# Patient Record
Sex: Female | Born: 1965 | Race: White | Hispanic: No | Smoking: Current every day smoker
Health system: Southern US, Community
[De-identification: ages and names within clinical notes are randomized; demographics above are authoritative.]

## PROBLEM LIST (undated history)

## (undated) DIAGNOSIS — Q625 Duplication of ureter: Secondary | ICD-10-CM

## (undated) DIAGNOSIS — R87613 High grade squamous intraepithelial lesion on cytologic smear of cervix (HGSIL): Secondary | ICD-10-CM

## (undated) DIAGNOSIS — J45909 Unspecified asthma, uncomplicated: Secondary | ICD-10-CM

## (undated) DIAGNOSIS — K5090 Crohn's disease, unspecified, without complications: Secondary | ICD-10-CM

## (undated) HISTORY — PX: EYE SURGERY: SHX253

## (undated) HISTORY — DX: High grade squamous intraepithelial lesion on cytologic smear of cervix (HGSIL): R87.613

## (undated) HISTORY — PX: APPENDECTOMY: SHX54

## (undated) HISTORY — DX: Crohn's disease, unspecified, without complications: K50.90

## (undated) HISTORY — PX: CHOLECYSTECTOMY: SHX55

## (undated) HISTORY — DX: Duplication of ureter: Q62.5

---
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
## 1996-04-17 HISTORY — PX: COLON RESECTION: SHX5231

## 2007-10-03 ENCOUNTER — Emergency Department: Payer: Self-pay | Admitting: Emergency Medicine
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
## 2011-05-03 ENCOUNTER — Ambulatory Visit: Payer: Self-pay | Admitting: Gastroenterology

## 2011-05-03 IMAGING — CR DG SMALL BOWEL
1 series · 12 of 12 positions shown · non-contrast
Comparison: none

REASON FOR EXAM: Crohn's  Disease  Abd Pain
COMMENTS:

PROCEDURE:     FL  - FL SMALL BOWEL  - [DATE] [DATE]
RESULT:
Procedure: Scout imaging of the abdomen was obtained. The patient was then
administered oral barium. Overhead imaging of the abdomen was obtained
followed by spot compression focal images.

[Series 1: run · 12 of 12 slices shown]
[im 1/12]
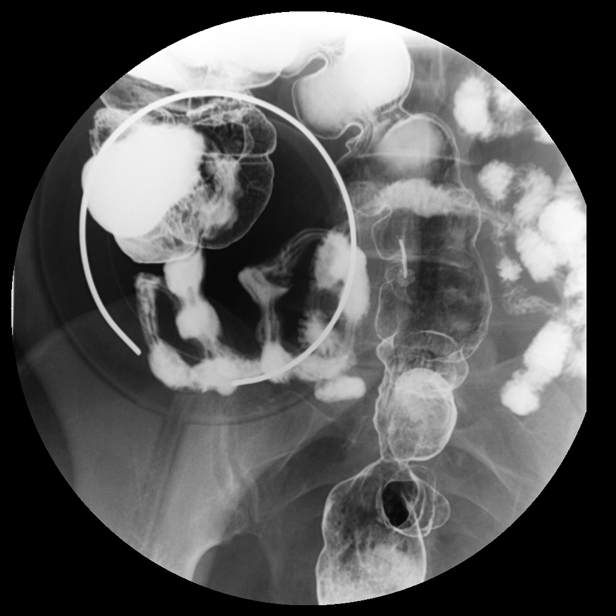
[im 2/12]
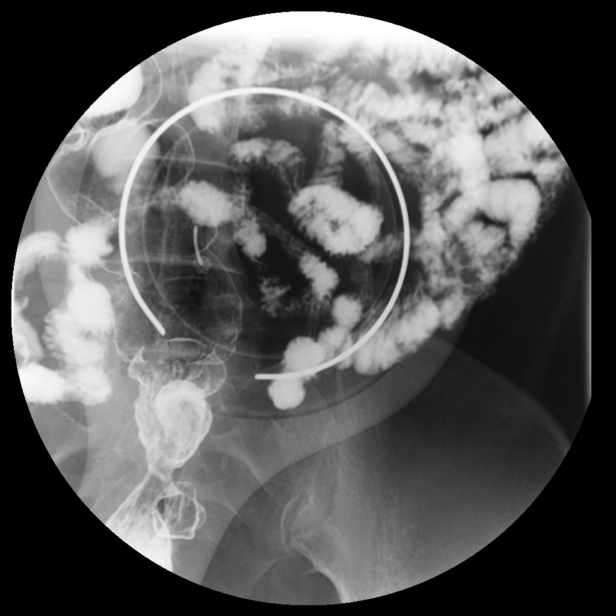
[im 3/12]
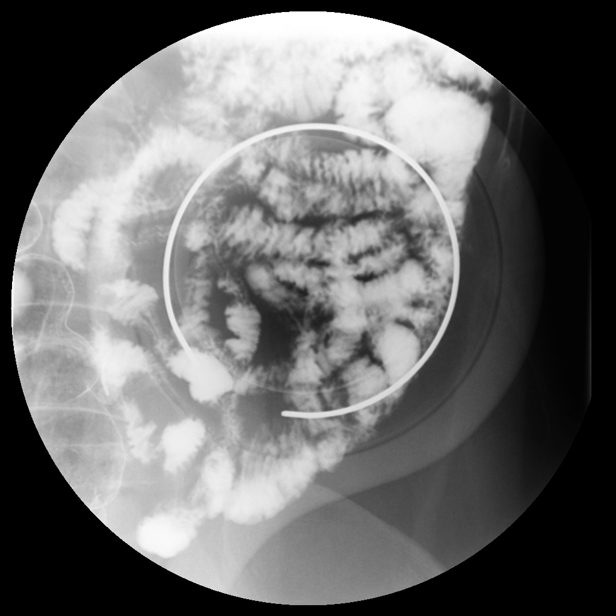
[im 4/12]
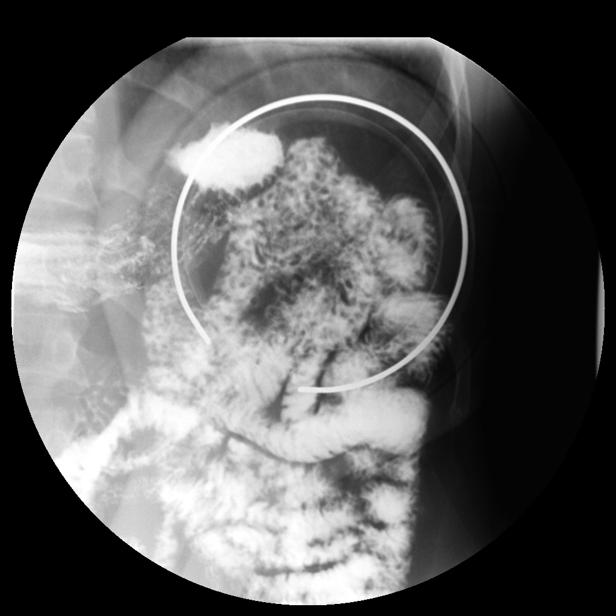
[im 5/12]
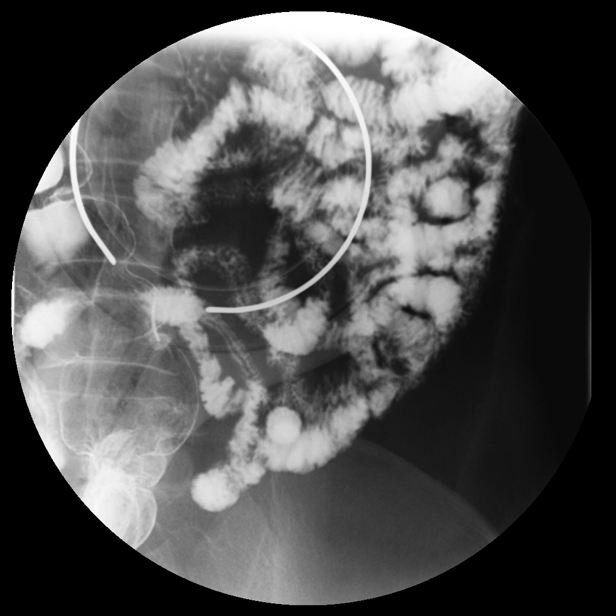
[im 6/12]
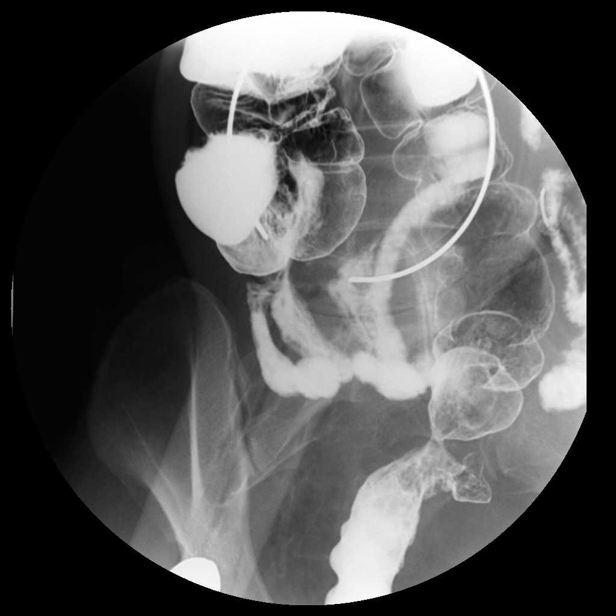
[im 7/12]
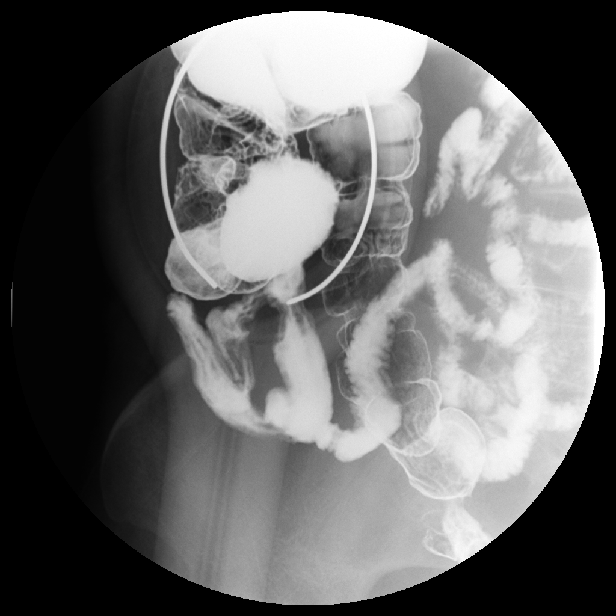
[im 8/12]
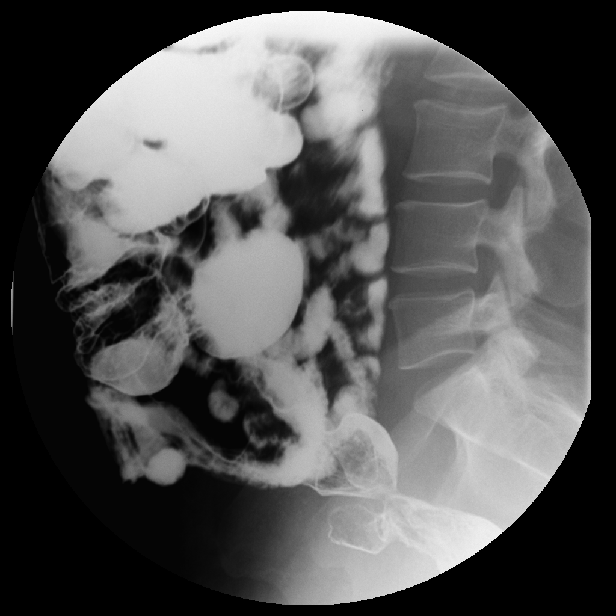
[im 9/12]
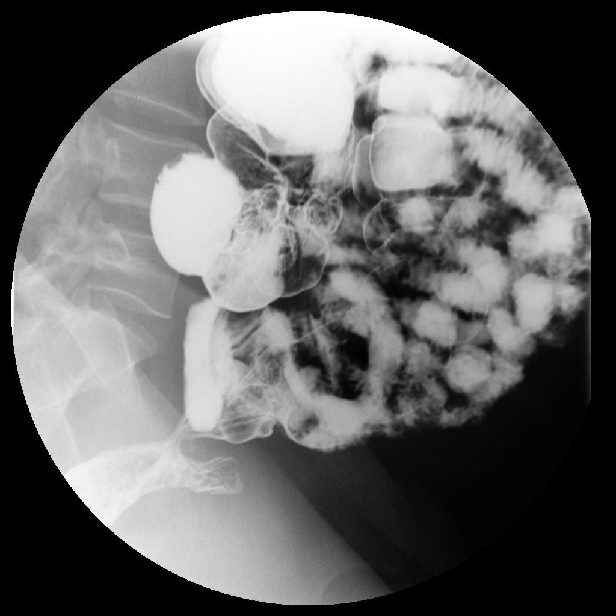
[im 10/12]
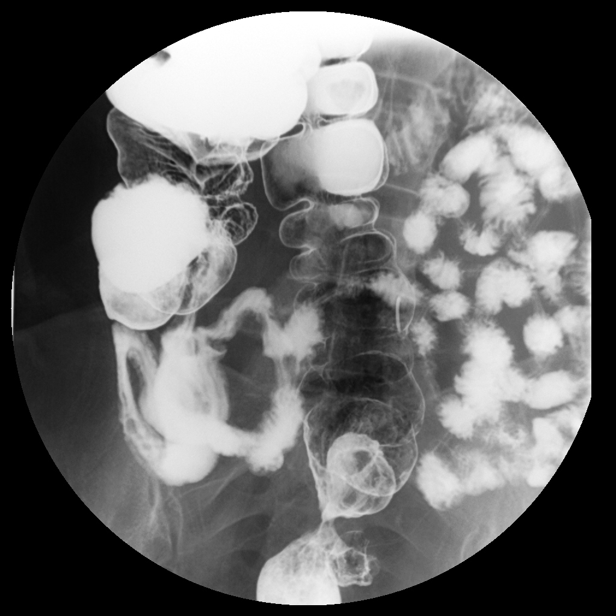
[im 11/12]
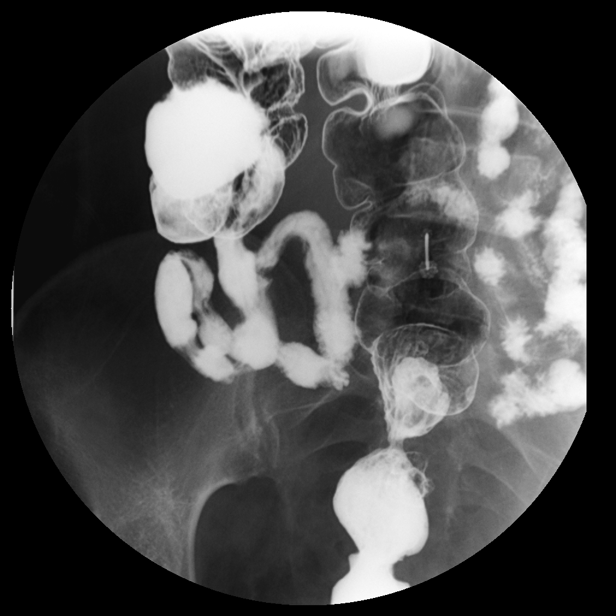
[im 12/12]
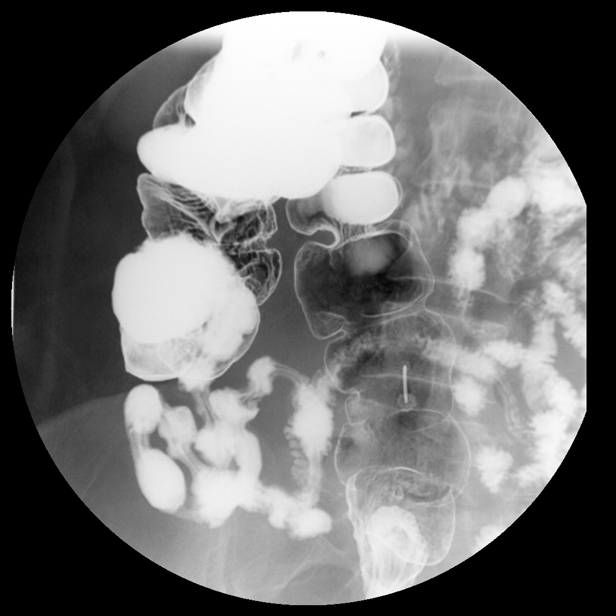

[12 of 12 positions shown; findings below may reference images not displayed]

FINDINGS: Air is seen within nondilated loops of large and small bowel. The
visualized bony skeleton is unremarkable.

Contrast is appreciated within the stomach and duodenum as well as the
jejunum and proximal ileum. The duodenum demonstrates appropriate rotation
and placement of the ligament of Treitz. Transit time to the terminal ileum
appears to be within normal limits.

Evaluation of the distal ileum demonstrates no evidence of filling defects
nor focal regions of narrowing. The peristalsis within the distal ileum
appears to be slightly decreased. The ileum does contract and distend. The
remaining components of the bowel demonstrate no further abnormalities.
IMPRESSION: Findings raising concern of decreased peristalsis within
the distal and terminal ileum without further abnormalities.

## 2011-05-30 ENCOUNTER — Ambulatory Visit: Payer: Self-pay | Admitting: Gastroenterology

## 2013-05-04 ENCOUNTER — Emergency Department: Payer: Self-pay | Admitting: Emergency Medicine

## 2013-05-04 LAB — CBC
HCT: 44.4 % (ref 35.0–47.0)
HGB: 14.6 g/dL (ref 12.0–16.0)
MCH: 29.2 pg (ref 26.0–34.0)
MCHC: 33 g/dL (ref 32.0–36.0)
MCV: 89 fL (ref 80–100)
PLATELETS: 262 10*3/uL (ref 150–440)
RBC: 5 10*6/uL (ref 3.80–5.20)
RDW: 14.2 % (ref 11.5–14.5)
WBC: 9.7 10*3/uL (ref 3.6–11.0)

## 2013-05-04 LAB — BASIC METABOLIC PANEL
Anion Gap: 8 (ref 7–16)
BUN: 10 mg/dL (ref 7–18)
CO2: 24 mmol/L (ref 21–32)
Calcium, Total: 8.7 mg/dL (ref 8.5–10.1)
Chloride: 106 mmol/L (ref 98–107)
Creatinine: 0.92 mg/dL (ref 0.60–1.30)
EGFR (Non-African Amer.): >60
GLUCOSE: 85 mg/dL (ref 65–99)
Osmolality: 274 (ref 275–301)
POTASSIUM: 4 mmol/L (ref 3.5–5.1)
Sodium: 138 mmol/L (ref 136–145)

## 2013-05-04 LAB — TROPONIN I: Troponin-I: <0.02 ng/mL

## 2013-05-04 IMAGING — CR DG CHEST 2V
1 series · 2 of 2 positions shown · non-contrast
Comparison: None.

CLINICAL DATA: Short of breath.  Cough.

EXAM:
CHEST  2 VIEW

[Series 1: w chest pa · 0.14mm/px · 2 of 2 slices shown]
[im 1/2]
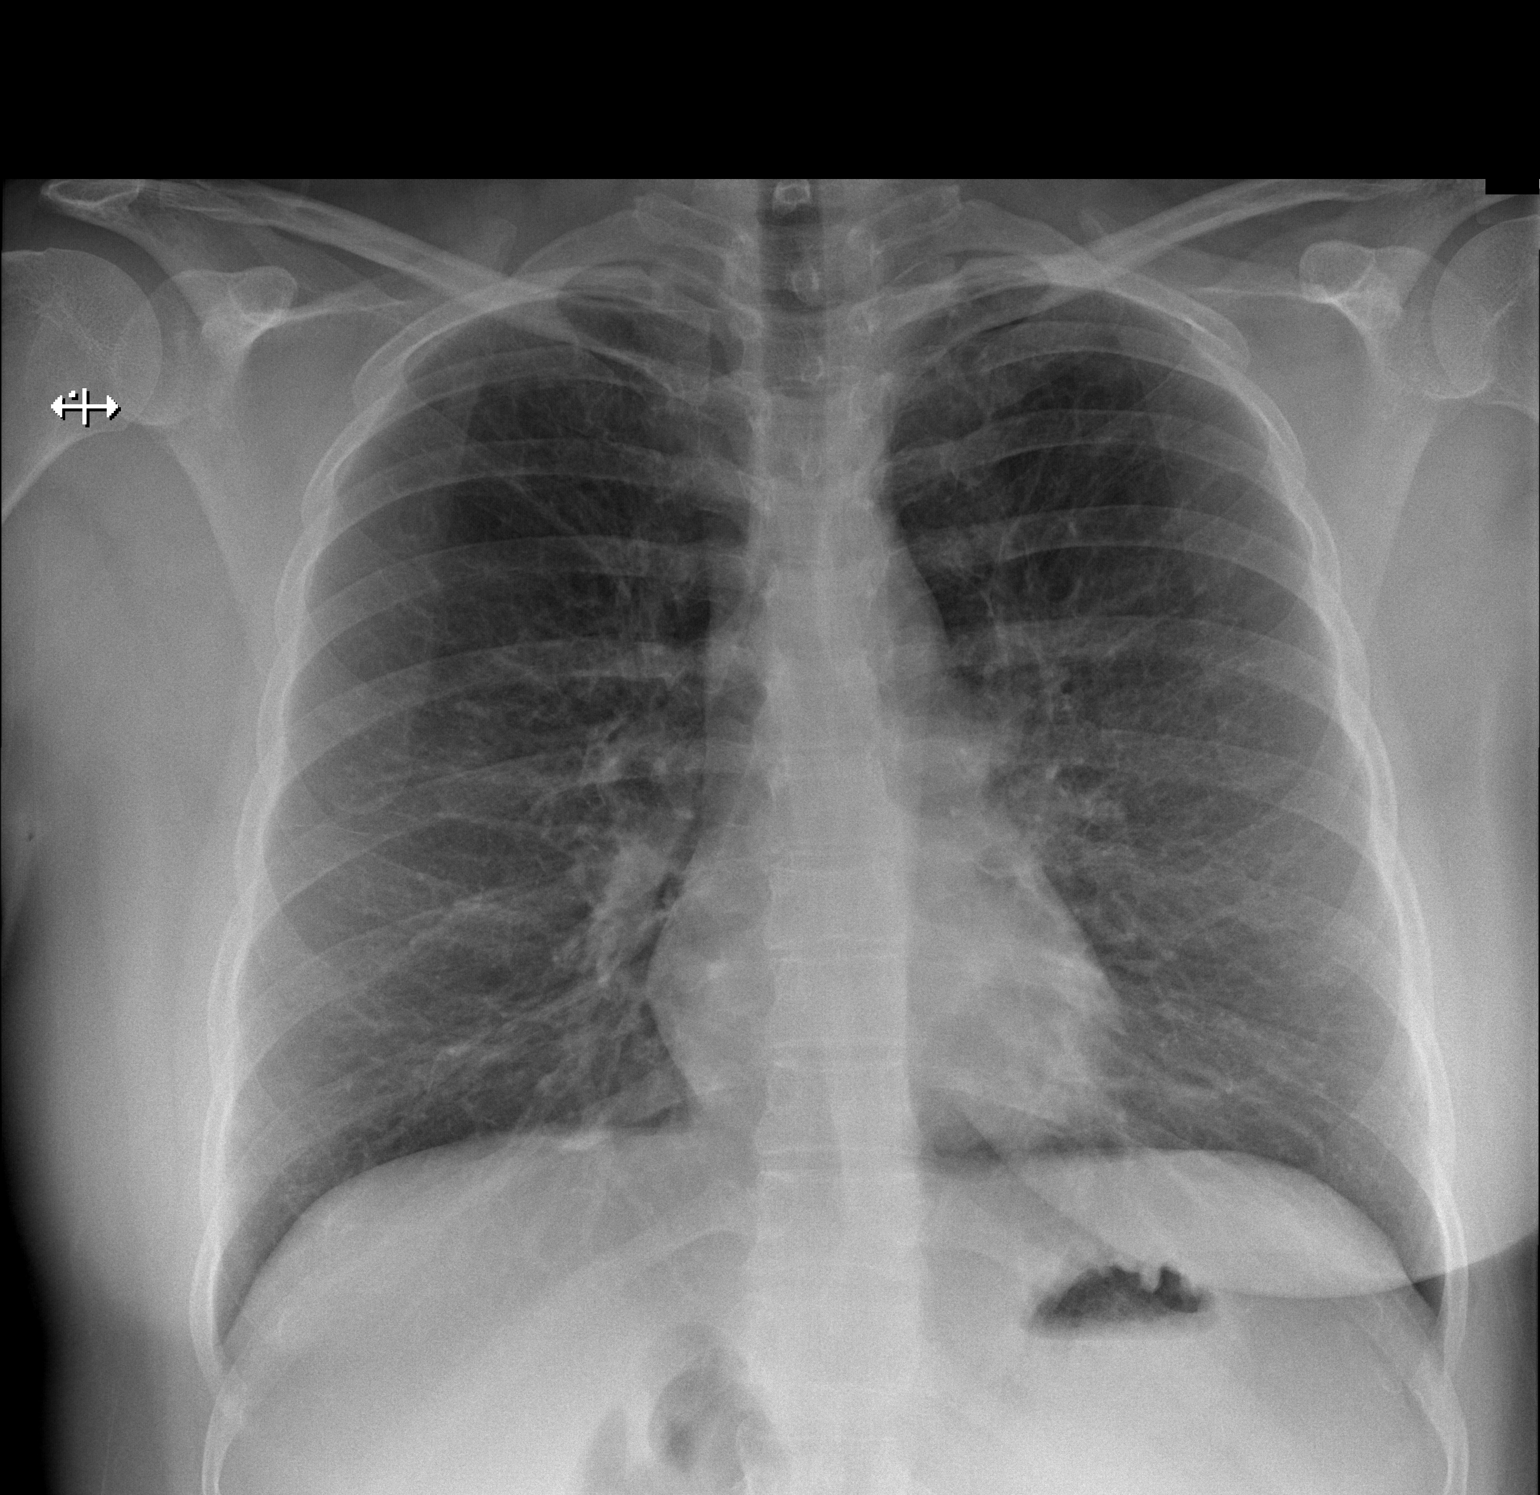
[im 2/2]
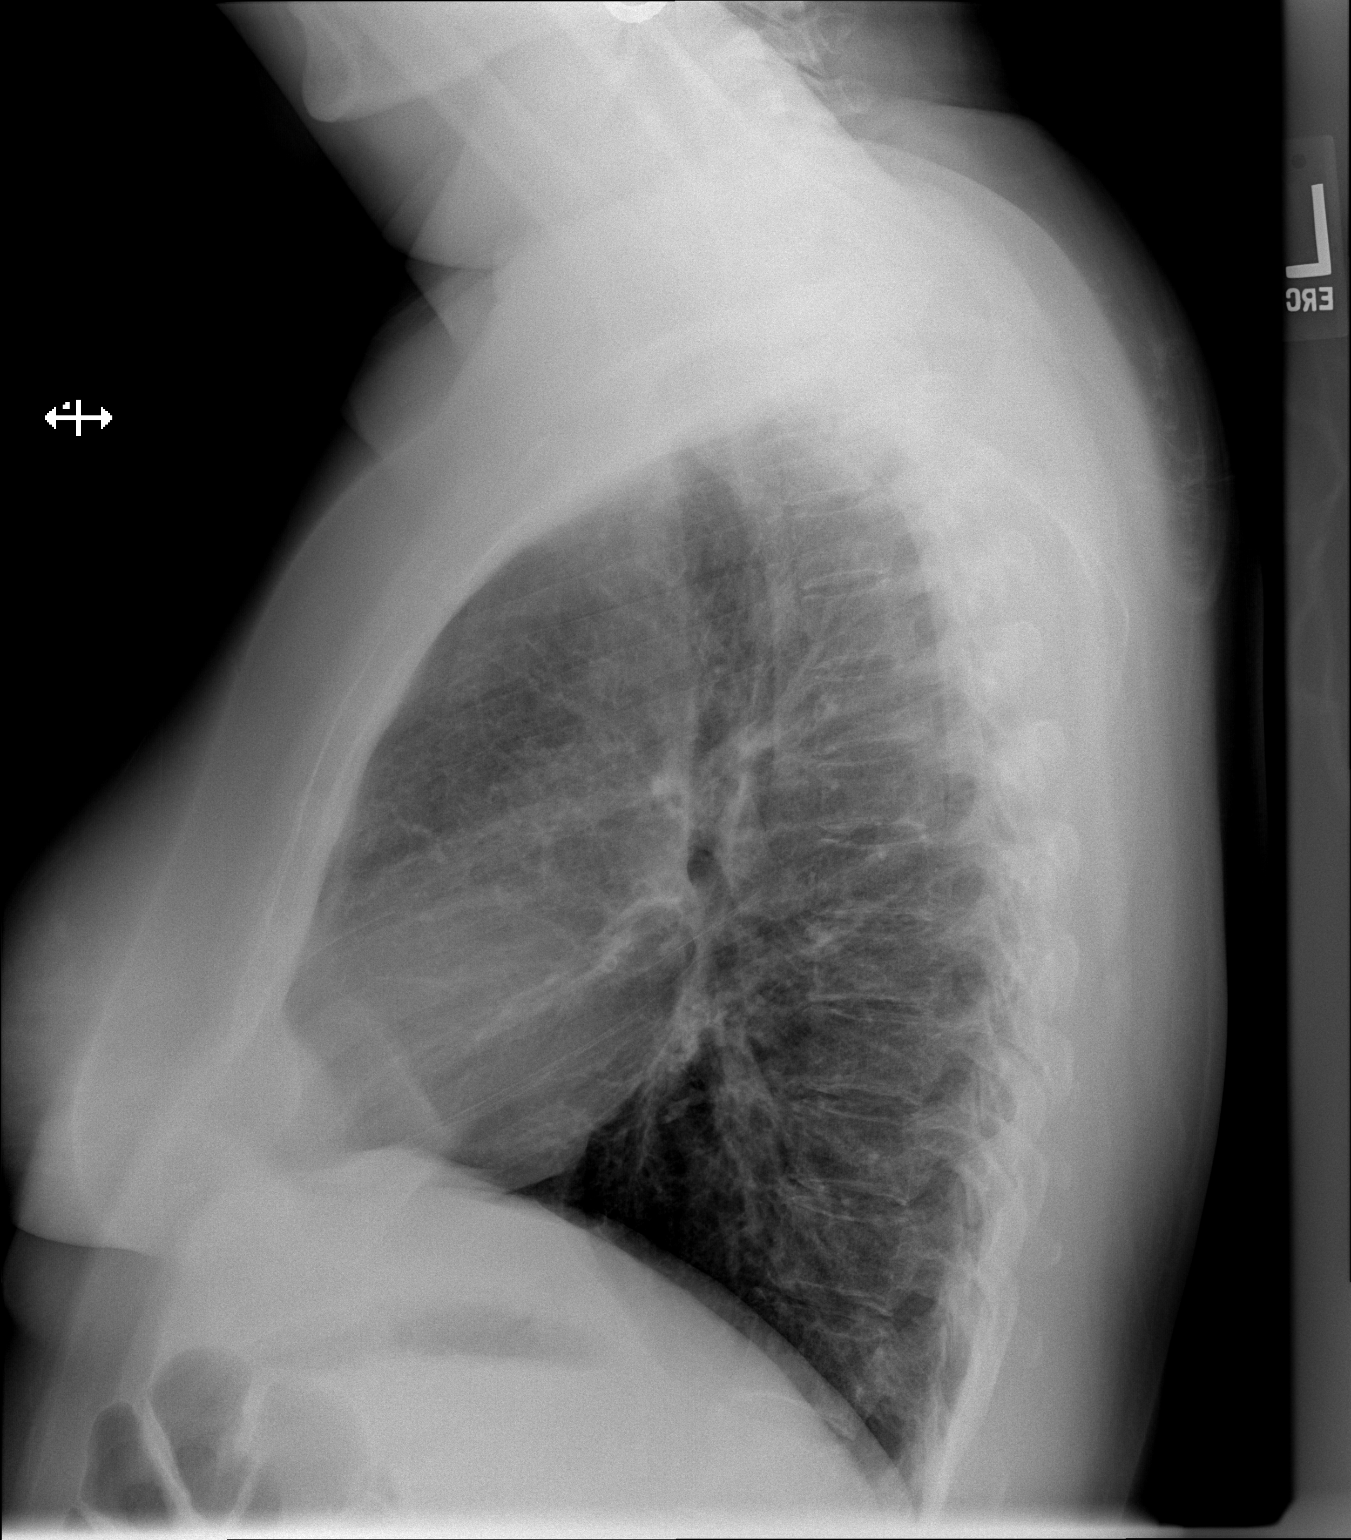

[2 of 2 positions shown; findings below may reference images not displayed]

FINDINGS: Midline trachea. Normal heart size and mediastinal contours. No
pleural effusion or pneumothorax. Diffuse peribronchial thickening.
No lobar consolidation.
IMPRESSION: Interstitial thickening, favored to be the sequelae of chronic
bronchitis or smoking. In the acute setting, especially if the
patient is a nonsmoker, atypical bacterial (mycoplasma) or viral
pneumonia would be a secondary concern.

## 2013-11-27 ENCOUNTER — Ambulatory Visit: Payer: Self-pay | Admitting: Gastroenterology

## 2014-08-06 DIAGNOSIS — Q625 Duplication of ureter: Secondary | ICD-10-CM | POA: Insufficient documentation

## 2014-08-06 DIAGNOSIS — R87613 High grade squamous intraepithelial lesion on cytologic smear of cervix (HGSIL): Secondary | ICD-10-CM

## 2014-08-06 DIAGNOSIS — N823 Fistula of vagina to large intestine: Secondary | ICD-10-CM | POA: Insufficient documentation

## 2014-08-06 DIAGNOSIS — K5090 Crohn's disease, unspecified, without complications: Secondary | ICD-10-CM | POA: Insufficient documentation

## 2014-08-06 HISTORY — DX: Duplication of ureter: Q62.5

## 2014-09-16 ENCOUNTER — Inpatient Hospital Stay: Payer: BLUE CROSS/BLUE SHIELD | Attending: Obstetrics and Gynecology | Admitting: Obstetrics and Gynecology

## 2014-09-16 VITALS — BP 155/100 | HR 105 | Temp 98.5°F | Resp 18 | Ht 61.0 in | Wt 160.7 lb

## 2014-09-16 DIAGNOSIS — Z79899 Other long term (current) drug therapy: Secondary | ICD-10-CM | POA: Insufficient documentation

## 2014-09-16 DIAGNOSIS — N939 Abnormal uterine and vaginal bleeding, unspecified: Secondary | ICD-10-CM

## 2014-09-16 DIAGNOSIS — M4802 Spinal stenosis, cervical region: Secondary | ICD-10-CM | POA: Diagnosis not present

## 2014-09-16 DIAGNOSIS — K5090 Crohn's disease, unspecified, without complications: Secondary | ICD-10-CM | POA: Diagnosis not present

## 2014-09-16 DIAGNOSIS — R197 Diarrhea, unspecified: Secondary | ICD-10-CM | POA: Insufficient documentation

## 2014-09-16 DIAGNOSIS — Z7952 Long term (current) use of systemic steroids: Secondary | ICD-10-CM | POA: Insufficient documentation

## 2014-09-16 DIAGNOSIS — Q625 Duplication of ureter: Secondary | ICD-10-CM | POA: Diagnosis not present

## 2014-09-16 DIAGNOSIS — F17210 Nicotine dependence, cigarettes, uncomplicated: Secondary | ICD-10-CM | POA: Diagnosis not present

## 2014-09-16 DIAGNOSIS — Z9049 Acquired absence of other specified parts of digestive tract: Secondary | ICD-10-CM

## 2014-09-16 DIAGNOSIS — R87613 High grade squamous intraepithelial lesion on cytologic smear of cervix (HGSIL): Secondary | ICD-10-CM | POA: Diagnosis not present

## 2014-09-16 HISTORY — DX: Abnormal uterine and vaginal bleeding, unspecified: N93.9

## 2014-09-16 NOTE — Progress Notes (Signed)
Gynecologic Oncology Interval Note  Chief Complaint: HSIL PAP and abnormal uterine bleeding for preop evaluation.  Subjective:  Oncology Treatment History:  Elizabeth Zhang is a 49 y.o. female who is seen in consultation from Dr. Mick Sell for HGSIL pap smear from 06/18/14,   Elizabeth Zhang has had many abnormal pap smears of the cervix over the years; she has been treated with both cryo therapy and a LEEP procedure, done about 10 years ago.   Her most recent pap history is as follows: 06/18/14 HSIL and ECC lGSIL 02/24/14 Colpo : Bx: LGSIL, ECC: HGSIL 01/23/14 ASCUS  [name] does endorse some post coital spotting for the past six months, as well as increasingly heavy periods for the past year. She now reports bleeding heavily for up to three weeks, using a pad or tampon almost every hour at its heaviest. She was recently started on Lysteda, which has shortened the bleeding to about two weeks. She has not had an endometrial biopsy done to her knowledge.   In terms of her other medical history, Elizabeth Zhang has a history of Crohn's disease, though states she is currently in remission. She had been on prednisone for nearly 12 years, and in 1998, required an exploratory laparotomy, large bowel resection and appendectomy with temporary colostomy, followed by a colostomy take down six weeks later. Most recently she had a small RV fistula, though has not yet pursued surgical management.  She does have some diarrhea.  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  Past Gynecologic History:  Menarche: 17 Menstrual details: Lasts up to 21 days days, heavy flow Menses regular: no Last Menstrual Period: 07/26/14 History of OCP/HRT use: No History of Abnormal pap: yes, HSIL, 3/16 Last pap: 3/16 History of STDs: The patient denies history of sexually transmitted disease. Contraception: none Sexually active: yes  Problem List: Patient  Active Problem List   Diagnosis Date Noted  . Abnormal uterine bleeding 09/16/2014  . HSIL (high grade squamous intraepithelial lesion) on Pap smear of cervix 09/16/2014   Past Medical History: Past Medical History  Diagnosis Date  . Crohn disease     1980  . Duplicated ureter, left   . Cesarean delivery delivered     Past Surgical History: Past Surgical History  Procedure Laterality Date  . Appendectomy    . Cholecystectomy      Family History: Family History  Problem Relation Age of Onset  . Heart attack Father     Had Triple Bypass  . Lung disease Mother     Double lung transplant 2011    Social History: History   Social History  . Marital Status: Married    Spouse Name: N/A  . Number of Children: N/A  . Years of Education: N/A   Occupational History  . Not on file.   Social History Main Topics  . Smoking status: Current Every Day Smoker -- 0.25 packs/day for 30 years    Types: Cigarettes  . Smokeless tobacco: Never Used  . Alcohol Use: No  . Drug Use: No  . Sexual Activity: Not on file   Other Topics Concern  . Not on file   Social History Narrative  . No narrative on file    Allergies: Allergies  Allergen Reactions  . Morphine Hives and Nausea And Vomiting  . Nickel Rash  . Penicillin G Rash    Current Medications: Current Outpatient Prescriptions  Medication Sig Dispense Refill  . ALPRAZolam (XANAX) 1 MG tablet Take by mouth.    . cetirizine (ZYRTEC) 10 MG tablet Take 1 tablet by mouth daily.    . ferrous sulfate 325 (65 FE) MG tablet Take 1 tablet by mouth daily.    . fluticasone (FLONASE) 50 MCG/ACT nasal spray Place 2 sprays into the nose daily.    Marland Kitchen omeprazole (PRILOSEC) 40 MG capsule Take 40 mg by mouth daily as needed.    . tranexamic acid (LYSTEDA) 650 MG TABS tablet Take 2 tablets by mouth 3 (three) times daily as needed.     No current facility-administered medications for this visit.     Review of Systems A comprehensive  review of systems was negative.  Objective:  Physical Examination:  BP 155/100 mmHg  Pulse 105  Temp(Src) 98.5 F (36.9 C) (Tympanic)  Resp 18  Ht 5' 1"  (1.549 m)  Wt 160 lb 11.5 oz (72.9 kg)  BMI 30.38 kg/m2  ECOG Performance Status: 0 - Asymptomatic  General appearance: alert, cooperative and appears stated age HEENT: PERRL, EOMI, sclera clear Neck: no thyroid enlargement or cervical adenopathy Lymph node survey: non-palpable nodes Cardiovascular: regular rate and rhythm, without murmurs, rubs or gallops Respiratory: clear to auscultation Abdomen: no palpable masses, no hernias, nontender, nondistended,bowel sounds present,without hepatosplenomegaly Back: inspection of back is normal Extremities: no lower extremity edema Skin exam - normal coloration and turgor, no rashes, no suspicious skin lesions noted. Neurological exam reveals alert, oriented, normal speech, no focal findings or movement disorder noted. Pelvic: deferred  Assessment:  Elizabeth Zhang is a 49 y.o. female with HSIL PAP with positive ECC and abnormal uterine bleeding with cervical stenosis. 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  Plan:   Problem List Items Addressed This Visit    Abnormal uterine bleeding   HSIL (high grade squamous intraepithelial lesion) on Pap smear of cervix - Primary     We discussed options for management including CKC given her HSIL Pap and positive ECC, as well as hysteroscopy, D&C given her abnormal uterine bleeding (unable to perform EMB in the office due to cervical stenosis). While she would otherwise be a good candidate for definitive surgical management (ie hysterectomy), she has a history of extensive abdominal adhesions, and therefore may not be a good candidate for laparoscopic surgery. She preferred to proceed with the CKC, and then proceed with more invasive surgery if invasive cancer was identified  in the uterus or cervix.   Therefore, we recommend a cold knife cone and hysteroscopy, D&C. Ms. Jobin was consented for an exam under anesthesia, colposcopy, cold knife conization, and endocervical curettage. She was counseled on the risks of surgery including but not limited to: Bleeding (possibly requiring transfusion), Infection (possibly requiring antibiotics), damage to nearby organs: bowel, bladder, blood vessels, nerves, ureters, uterus, vagina, cervix, need for further surgery/procedures, delayed wound healing, Anesthesia risks, medical complications: thromboembolic events (blood clot to lung, brain, legs), pneumonia, heart attack, stroke, death, cervical incompetence resulting in fertility/pregnancy complications and recurrence of dysplasia.   Ms. Guerrini's surgery was scheduled for 10/07/14: consents were signed today.  She was counseled to avoid unprotected intercourse between now and surgery, but will need a bHCG the day of surgery as she is not using any form of contraception.   Suggested return to clinic in 4-6 weeks after her surgery for a post-operative visit   The  patient's diagnosis, an outline of the further diagnostic and laboratory studies which will be required, the recommendation, and alternatives were discussed. All questions were answered to the patient's satisfaction.  Mellody Drown, MD  CC:  Mellody Drown, MD [facility] [place], [place] [address] [phone]

## 2014-09-16 NOTE — Patient Instructions (Signed)
Do not have unprotected sexual intercourse prior to surgery.  Your surgery will be scheduled on 10/07/14.  Renita Papa, RN will call you regarding your surgery date.

## 2014-09-24 ENCOUNTER — Telehealth: Payer: Self-pay | Admitting: *Deleted

## 2014-09-24 ENCOUNTER — Other Ambulatory Visit: Payer: BLUE CROSS/BLUE SHIELD

## 2014-09-24 NOTE — Telephone Encounter (Signed)
Patient notified regarding her preadmit testing apt. This has been scheduled for Monday, September 28, 2014 at 830am.  Surgery date is planned for 10/07/14.

## 2014-09-28 ENCOUNTER — Ambulatory Visit
Admission: RE | Admit: 2014-09-28 | Discharge: 2014-09-28 | Disposition: A | Discharge status: Home / Self Care | Payer: BLUE CROSS/BLUE SHIELD | Source: Ambulatory Visit | Attending: Obstetrics and Gynecology | Admitting: Obstetrics and Gynecology

## 2014-09-28 DIAGNOSIS — K5090 Crohn's disease, unspecified, without complications: Secondary | ICD-10-CM | POA: Insufficient documentation

## 2014-09-28 DIAGNOSIS — Z885 Allergy status to narcotic agent status: Secondary | ICD-10-CM | POA: Insufficient documentation

## 2014-09-28 DIAGNOSIS — Z01812 Encounter for preprocedural laboratory examination: Secondary | ICD-10-CM | POA: Diagnosis present

## 2014-09-28 DIAGNOSIS — N939 Abnormal uterine and vaginal bleeding, unspecified: Secondary | ICD-10-CM | POA: Diagnosis not present

## 2014-09-28 DIAGNOSIS — Z880 Allergy status to penicillin: Secondary | ICD-10-CM | POA: Diagnosis not present

## 2014-09-28 DIAGNOSIS — R87613 High grade squamous intraepithelial lesion on cytologic smear of cervix (HGSIL): Secondary | ICD-10-CM | POA: Diagnosis not present

## 2014-09-28 LAB — TYPE AND SCREEN
ABO/RH(D): B POS
Antibody Screen: NEGATIVE

## 2014-09-28 LAB — CBC WITH DIFFERENTIAL/PLATELET
Basophils Absolute: 0.1 10*3/uL (ref 0–0.1)
Basophils Relative: 0 %
EOS ABS: 0.2 10*3/uL (ref 0–0.7)
Eosinophils Relative: 1 %
HCT: 43.4 % (ref 35.0–47.0)
Hemoglobin: 13.9 g/dL (ref 12.0–16.0)
LYMPHS ABS: 1 10*3/uL (ref 1.0–3.6)
LYMPHS PCT: 6 %
MCH: 29.4 pg (ref 26.0–34.0)
MCHC: 32.1 g/dL (ref 32.0–36.0)
MCV: 91.4 fL (ref 80.0–100.0)
MONO ABS: 1.3 10*3/uL — AB (ref 0.2–0.9)
MONOS PCT: 7 %
NEUTROS PCT: 86 %
Neutro Abs: 15.7 10*3/uL — ABNORMAL HIGH (ref 1.4–6.5)
Platelets: 274 10*3/uL (ref 150–440)
RBC: 4.75 MIL/uL (ref 3.80–5.20)
RDW: 14.7 % — ABNORMAL HIGH (ref 11.5–14.5)
WBC: 18.2 10*3/uL — ABNORMAL HIGH (ref 3.6–11.0)

## 2014-09-28 LAB — BASIC METABOLIC PANEL
Anion gap: 6 (ref 5–15)
BUN: 9 mg/dL (ref 6–20)
CALCIUM: 8.4 mg/dL — AB (ref 8.9–10.3)
CO2: 21 mmol/L — ABNORMAL LOW (ref 22–32)
Chloride: 112 mmol/L — ABNORMAL HIGH (ref 101–111)
Creatinine, Ser: 0.79 mg/dL (ref 0.44–1.00)
GFR calc non Af Amer: >60 mL/min (ref >60)
GLUCOSE: 99 mg/dL (ref 65–99)
POTASSIUM: 3.9 mmol/L (ref 3.5–5.1)
Sodium: 139 mmol/L (ref 135–145)

## 2014-09-28 LAB — APTT: aPTT: 32 seconds (ref 24–36)

## 2014-09-28 LAB — PROTIME-INR
INR: 1.06
PROTHROMBIN TIME: 14 s (ref 11.4–15.0)

## 2014-09-28 LAB — ABO/RH: ABO/RH(D): B POS

## 2014-09-28 NOTE — Patient Instructions (Addendum)
  Your procedure is scheduled on: Wednesday October 07, 2014 Report to Same Day Surgery. To find out your arrival time please call [phone] between 1PM - 3PM on Tuesday October 06, 2014.  Remember: Instructions that are not followed completely may result in serious medical risk, up to and including death, or upon the discretion of your surgeon and anesthesiologist your surgery may need to be rescheduled.    __x__ 1. Do not eat food or drink liquids after midnight. No gum chewing or hard candies.     __x__ 2. No Alcohol for 24 hours before or after surgery.   ____ 3. Bring all medications with you on the day of surgery if instructed.    __x__ 4. Notify your doctor if there is any change in your medical condition     (cold, fever, infections).     Do not wear jewelry, make-up, hairpins, clips or nail polish.  Do not wear lotions, powders, or perfumes. You may wear deodorant.  Do not shave 48 hours prior to surgery. Men may shave face and neck.  Do not bring valuables to the hospital.    [facility] is not responsible for any belongings or valuables.               Contacts, dentures or bridgework may not be worn into surgery.  Leave your suitcase in the car. After surgery it may be brought to your room.  For patients admitted to the hospital, discharge time is determined by your treatment team.   Patients discharged the day of surgery will not be allowed to drive home.    Please read over the following fact sheets that you were given:    _x_ Take these medicines the morning of surgery with A SIP OF WATER:    1. omeprazole (PRILOSEC)    __x_ Fleet Enema (as directed)   ____ Use CHG Soap as directed  ____ Use inhalers on the day of surgery  ____ Stop metformin 2 days prior to surgery    ____ Take 1/2 of usual insulin dose the night before surgery and none on the morning of surgery.   ____ Stop Coumadin/Plavix/aspirin on   _x___ Stop Anti-inflammatories now, you may take  Tylenol for pain.   ____ Stop supplements until after surgery.    ____ Bring C-Pap to the hospital.

## 2014-10-07 ENCOUNTER — Encounter: Payer: Self-pay | Admitting: *Deleted

## 2014-10-07 ENCOUNTER — Ambulatory Visit
Admission: RE | Admit: 2014-10-07 | Discharge: 2014-10-07 | Disposition: A | Discharge status: Home / Self Care | Payer: BLUE CROSS/BLUE SHIELD | Source: Ambulatory Visit | Attending: Obstetrics and Gynecology | Admitting: Obstetrics and Gynecology

## 2014-10-07 ENCOUNTER — Ambulatory Visit: Payer: BLUE CROSS/BLUE SHIELD | Admitting: Anesthesiology

## 2014-10-07 ENCOUNTER — Encounter
Admission: RE | Disposition: A | Discharge status: Home / Self Care | Payer: Self-pay | Source: Ambulatory Visit | Attending: Obstetrics and Gynecology

## 2014-10-07 DIAGNOSIS — Z79899 Other long term (current) drug therapy: Secondary | ICD-10-CM | POA: Insufficient documentation

## 2014-10-07 DIAGNOSIS — F17210 Nicotine dependence, cigarettes, uncomplicated: Secondary | ICD-10-CM | POA: Diagnosis not present

## 2014-10-07 DIAGNOSIS — Z885 Allergy status to narcotic agent status: Secondary | ICD-10-CM | POA: Diagnosis not present

## 2014-10-07 DIAGNOSIS — N879 Dysplasia of cervix uteri, unspecified: Secondary | ICD-10-CM

## 2014-10-07 DIAGNOSIS — K5090 Crohn's disease, unspecified, without complications: Secondary | ICD-10-CM | POA: Insufficient documentation

## 2014-10-07 DIAGNOSIS — Z880 Allergy status to penicillin: Secondary | ICD-10-CM | POA: Diagnosis not present

## 2014-10-07 DIAGNOSIS — R87613 High grade squamous intraepithelial lesion on cytologic smear of cervix (HGSIL): Secondary | ICD-10-CM | POA: Diagnosis not present

## 2014-10-07 DIAGNOSIS — E669 Obesity, unspecified: Secondary | ICD-10-CM | POA: Diagnosis not present

## 2014-10-07 DIAGNOSIS — N939 Abnormal uterine and vaginal bleeding, unspecified: Secondary | ICD-10-CM | POA: Diagnosis not present

## 2014-10-07 HISTORY — PX: HYSTEROSCOPY WITH D & C: SHX1775

## 2014-10-07 LAB — CBC
HEMATOCRIT: 40.4 % (ref 35.0–47.0)
HEMOGLOBIN: 13.1 g/dL (ref 12.0–16.0)
MCH: 29.6 pg (ref 26.0–34.0)
MCHC: 32.6 g/dL (ref 32.0–36.0)
MCV: 90.9 fL (ref 80.0–100.0)
Platelets: 360 10*3/uL (ref 150–440)
RBC: 4.44 MIL/uL (ref 3.80–5.20)
RDW: 14.8 % — ABNORMAL HIGH (ref 11.5–14.5)
WBC: 14.6 10*3/uL — ABNORMAL HIGH (ref 3.6–11.0)

## 2014-10-07 LAB — POCT PREGNANCY, URINE: Preg Test, Ur: NEGATIVE

## 2014-10-07 SURGERY — DILATATION AND CURETTAGE /HYSTEROSCOPY
Anesthesia: General | Wound class: Clean Contaminated

## 2014-10-07 MED ORDER — LACTATED RINGERS IV SOLN
INTRAVENOUS | Status: DC
  Administered 2014-10-07: 50 mL/h via INTRAVENOUS
  Administered 2014-10-07 (×2): via INTRAVENOUS

## 2014-10-07 MED ORDER — SODIUM CHLORIDE 0.9 % IR SOLN
Status: DC | PRN
  Administered 2014-10-07: 180 mL

## 2014-10-07 MED ORDER — IODINE STRONG (LUGOLS) 5 % PO SOLN
ORAL | Status: AC
  Filled 2014-10-07: qty 1

## 2014-10-07 MED ORDER — IBUPROFEN 800 MG PO TABS
ORAL_TABLET | ORAL | Status: AC
  Administered 2014-10-07: 18:00:00
  Filled 2014-10-07: qty 1

## 2014-10-07 MED ORDER — LIDOCAINE HCL (CARDIAC) 20 MG/ML IV SOLN
INTRAVENOUS | Status: DC | PRN
  Administered 2014-10-07: 30 mg via INTRAVENOUS

## 2014-10-07 MED ORDER — FERRIC SUBSULFATE 259 MG/GM EX SOLN
CUTANEOUS | Status: AC
  Filled 2014-10-07: qty 8

## 2014-10-07 MED ORDER — ONDANSETRON HCL 4 MG/2ML IJ SOLN: 4.0000 mg | Freq: Once | INTRAMUSCULAR | Status: DC | PRN

## 2014-10-07 MED ORDER — FENTANYL CITRATE (PF) 100 MCG/2ML IJ SOLN
INTRAMUSCULAR | Status: AC
  Administered 2014-10-07: 25 ug via INTRAVENOUS
  Filled 2014-10-07: qty 2

## 2014-10-07 MED ORDER — FENTANYL CITRATE (PF) 100 MCG/2ML IJ SOLN
25.0000 ug | INTRAMUSCULAR | Status: DC | PRN
  Administered 2014-10-07 (×4): 25 ug via INTRAVENOUS

## 2014-10-07 MED ORDER — LIDOCAINE-EPINEPHRINE 1 %-1:100000 IJ SOLN
INTRAMUSCULAR | Status: AC
Start: 2014-10-07 — End: 2014-10-07
  Filled 2014-10-07: qty 1

## 2014-10-07 MED ORDER — ESTROGENS, CONJUGATED 0.625 MG/GM VA CREA
TOPICAL_CREAM | VAGINAL | Status: AC
  Filled 2014-10-07: qty 30

## 2014-10-07 MED ORDER — FAMOTIDINE 20 MG PO TABS
ORAL_TABLET | ORAL | Status: AC
  Administered 2014-10-07: 20 mg via ORAL
  Filled 2014-10-07: qty 1

## 2014-10-07 MED ORDER — PROPOFOL 10 MG/ML IV BOLUS
INTRAVENOUS | Status: DC | PRN
  Administered 2014-10-07: 150 mg via INTRAVENOUS

## 2014-10-07 MED ORDER — FENTANYL CITRATE (PF) 100 MCG/2ML IJ SOLN
INTRAMUSCULAR | Status: DC | PRN
  Administered 2014-10-07 (×2): 50 ug via INTRAVENOUS

## 2014-10-07 MED ORDER — ONDANSETRON HCL 4 MG/2ML IJ SOLN
INTRAMUSCULAR | Status: DC | PRN
  Administered 2014-10-07: 4 mg via INTRAVENOUS

## 2014-10-07 MED ORDER — FAMOTIDINE 20 MG PO TABS
20.0000 mg | ORAL_TABLET | Freq: Once | ORAL | Status: AC
  Administered 2014-10-07: 20 mg via ORAL

## 2014-10-07 MED ORDER — FLEET ENEMA 7-19 GM/118ML RE ENEM: 1.0000 | ENEMA | Freq: Once | RECTAL | Status: DC

## 2014-10-07 MED ORDER — IBUPROFEN 800 MG PO TABS: 800.0000 mg | ORAL_TABLET | Freq: Once | ORAL | Status: DC

## 2014-10-07 MED ORDER — MIDAZOLAM HCL 2 MG/2ML IJ SOLN
INTRAMUSCULAR | Status: DC | PRN
  Administered 2014-10-07 (×2): 1 mg via INTRAVENOUS

## 2014-10-07 SURGICAL SUPPLY — 48 items
APPLICATOR COTTON TIP 6IN STRL (MISCELLANEOUS) IMPLANT
BLADE SURG SZ10 CARB STEEL (BLADE) IMPLANT
BLADE SURG SZ11 CARB STEEL (BLADE) IMPLANT
CANISTER SUCT 1200ML W/VALVE (MISCELLANEOUS) IMPLANT
CANISTER SUCT 3000ML (MISCELLANEOUS) IMPLANT
CATH ROBINSON RED A/P 16FR (CATHETERS) ×6 IMPLANT
CNTNR SPEC 2.5X3XGRAD LEK (MISCELLANEOUS) ×2
CONT SPEC 4OZ STER OR WHT (MISCELLANEOUS) ×4
CONTAINER SPEC 2.5X3XGRAD LEK (MISCELLANEOUS) ×2 IMPLANT
CORD URO TURP 10FT (MISCELLANEOUS) IMPLANT
DRAPE UNDER BUTTOCK W/FLU (DRAPES) ×3 IMPLANT
DRSG TELFA 3X8 NADH (GAUZE/BANDAGES/DRESSINGS) ×6 IMPLANT
ELECT BLADE 6 FLAT ULTRCLN (ELECTRODE) IMPLANT
ELECT HF LOOP 24F 2W 12 (MISCELLANEOUS) IMPLANT
ELECT RESECT POWERBALL 24F (MISCELLANEOUS) IMPLANT
GAUZE PACK 2X3YD (MISCELLANEOUS) IMPLANT
GLOVE BIO SURGEON STRL SZ 6.5 (GLOVE) ×2 IMPLANT
GLOVE BIO SURGEON STRL SZ7 (GLOVE) IMPLANT
GLOVE BIO SURGEON STRL SZ7.5 (GLOVE) IMPLANT
GLOVE BIO SURGEON STRL SZ8 (GLOVE) ×3 IMPLANT
GLOVE BIO SURGEONS STRL SZ 6.5 (GLOVE) ×1
GLOVE INDICATOR 7.5 STRL GRN (GLOVE) ×6 IMPLANT
GLYCINE 1.5% IRRIG UROMATIC (IV SOLUTION) IMPLANT
GOWN STRL REUS W/ TWL LRG LVL3 (GOWN DISPOSABLE) ×2 IMPLANT
GOWN STRL REUS W/TWL LRG LVL3 (GOWN DISPOSABLE) ×4
IV LACTATED RINGERS 1000ML (IV SOLUTION) IMPLANT
LABEL OR SOLS (LABEL) IMPLANT
NDL SAFETY 25GX1 (NEEDLE) IMPLANT
NS IRRIG 500ML POUR BTL (IV SOLUTION) ×3 IMPLANT
PACK BASIN MINOR ARMC (MISCELLANEOUS) IMPLANT
PACK DNC HYST (MISCELLANEOUS) ×3 IMPLANT
PAD GROUND ADULT SPLIT (MISCELLANEOUS) ×3 IMPLANT
PAD OB MATERNITY 4.3X12.25 (PERSONAL CARE ITEMS) ×3 IMPLANT
PAD PREP 24X41 OB/GYN DISP (PERSONAL CARE ITEMS) ×3 IMPLANT
ROLLER BALL 3MM 24FR (ELECTROSURGICAL) IMPLANT
SET CYSTO W/LG BORE CLAMP LF (SET/KITS/TRAYS/PACK) ×3 IMPLANT
SPONGE XRAY 4X4 16PLY STRL (MISCELLANEOUS) IMPLANT
STRAP SAFETY BODY (MISCELLANEOUS) ×3 IMPLANT
SUT VIC AB 0 CT1 27 (SUTURE)
SUT VIC AB 0 CT1 27XCR 8 STRN (SUTURE) IMPLANT
SUT VIC AB 0 CT1 36 (SUTURE) IMPLANT
SUT VIC AB 2-0 CT1 27 (SUTURE)
SUT VIC AB 2-0 CT1 TAPERPNT 27 (SUTURE) IMPLANT
SYR 50ML LL SCALE MARK (SYRINGE) ×3 IMPLANT
SYR CONTROL 10ML (SYRINGE) IMPLANT
SYRINGE 10CC LL (SYRINGE) IMPLANT
TUBING CONNECTING 10 (TUBING) ×2 IMPLANT
TUBING CONNECTING 10' (TUBING) ×1

## 2014-10-07 NOTE — Consult Note (Signed)
Patient just finished 10 day course of Septra for UTI with symptoms including flank pain suggestive of pyelonephritis.  This was treated by her family doctor who called and said that she had a confirmed positive urine culture and that the bacteria was sensitive to Septra.  She had a high fever and this resolved a week ago.  WBC has gone from 18.3 9 days ago to 14.6 today. Although the WBC is still elevated, she is asymptomatic.  I believe we can safely go ahead with the surgery today, but offered her the option of rescheduling in a few weeks.  She would like to proceed today. We will give her IV antibiotic intra operatively and will send another urine culture.  Mellody Drown, MD

## 2014-10-07 NOTE — Anesthesia Postprocedure Evaluation (Signed)
  Anesthesia Post-op Note  Patient: Elizabeth Zhang  Procedure(s) Performed: Procedure(s): DILATATION AND CURETTAGE /HYSTEROSCOPY (N/A)  Anesthesia type:General  Patient location: PACU  Post pain: Pain level controlled  Post assessment: Post-op Vital signs reviewed, Patient's Cardiovascular Status Stable, Respiratory Function Stable, Patent Airway and No signs of Nausea or vomiting  Post vital signs: Reviewed and stable  Last Vitals:  Filed Vitals:   10/07/14 1616  BP: 144/96  Pulse: 110  Temp: 36.3 C  Resp: 27    Level of consciousness: awake, alert  and patient cooperative  Complications: No apparent anesthesia complications

## 2014-10-07 NOTE — Anesthesia Preprocedure Evaluation (Addendum)
Anesthesia Evaluation  Patient identified by MRN, date of birth, ID band Patient awake    Reviewed: Allergy & Precautions, NPO status , Patient's Chart, lab work & pertinent test results  History of Anesthesia Complications Negative for: history of anesthetic complications  Airway Mallampati: II       Dental  (+) Partial Upper   Pulmonary Current Smoker,  + rhonchi         Cardiovascular negative cardio ROS Normal cardiovascular exam    Neuro/Psych negative neurological ROS     GI/Hepatic negative GI ROS, Neg liver ROS,   Endo/Other    Renal/GU negative Renal ROS  negative genitourinary   Musculoskeletal negative musculoskeletal ROS (+)   Abdominal (+) + obese,   Peds negative pediatric ROS (+)  Hematology negative hematology ROS (+)   Anesthesia Other Findings   Reproductive/Obstetrics negative OB ROS                            Anesthesia Physical Anesthesia Plan  ASA: III  Anesthesia Plan: General   Post-op Pain Management:    Induction: Intravenous  Airway Management Planned: LMA  Additional Equipment:   Intra-op Plan:   Post-operative Plan: Extubation in OR  Informed Consent: I have reviewed the patients History and Physical, chart, labs and discussed the procedure including the risks, benefits and alternatives for the proposed anesthesia with the patient or authorized representative who has indicated his/her understanding and acceptance.     Plan Discussed with: CRNA  Anesthesia Plan Comments:         Anesthesia Quick Evaluation

## 2014-10-07 NOTE — Discharge Instructions (Addendum)
General Anesthesia General anesthesia is a sleep-like state of non-feeling produced by medicines (anesthetics). General anesthesia prevents you from being alert and feeling pain during a medical procedure. Your caregiver may recommend general anesthesia if your procedure:  Is long.  Is painful or uncomfortable.  Would be frightening to see or hear.  Requires you to be still.  Affects your breathing.  Causes significant blood loss. LET YOUR CAREGIVER KNOW ABOUT:  Allergies to food or medicine.  Medicines taken, including vitamins, herbs, eyedrops, over-the-counter medicines, and creams.  Use of steroids (by mouth or creams).  Previous problems with anesthetics or numbing medicines, including problems experienced by relatives.  History of bleeding problems or blood clots.  Previous surgeries and types of anesthetics received.  Possibility of pregnancy, if this applies.  Use of cigarettes, alcohol, or illegal drugs.  Any health condition(s), especially diabetes, sleep apnea, and high blood pressure. RISKS AND COMPLICATIONS General anesthesia rarely causes complications. However, if complications do occur, they can be life threatening. Complications include:  A lung infection.  A stroke.  A heart attack.  Waking up during the procedure. When this occurs, the patient may be unable to move and communicate that he or she is awake. The patient may feel severe pain. Older adults and adults with serious medical problems are more likely to have complications than adults who are young and healthy. Some complications can be prevented by answering all of your caregiver's questions thoroughly and by following all pre-procedure instructions. It is important to tell your caregiver if any of the pre-procedure instructions, especially those related to diet, were not followed. Any food or liquid in the stomach can cause problems when you are under general anesthesia. BEFORE THE  PROCEDURE  Ask your caregiver if you will have to spend the night at the hospital. If you will not have to spend the night, arrange to have an adult drive you and stay with you for 24 hours.  Follow your caregiver's instructions if you are taking dietary supplements or medicines. Your caregiver may tell you to stop taking them or to reduce your dosage.  Do not smoke for as long as possible before your procedure. If possible, stop smoking 3-6 weeks before the procedure.  Do not take new dietary supplements or medicines within 1 week of your procedure unless your caregiver approves them.  Do not eat within 8 hours of your procedure or as directed by your caregiver. Drink only clear liquids, such as water, black coffee (without milk or cream), and fruit juices (without pulp).  Do not drink within 3 hours of your procedure or as directed by your caregiver.  You may brush your teeth on the morning of the procedure, but make sure to spit out the toothpaste and water when finished. PROCEDURE  You will receive anesthetics through a mask, through an intravenous (IV) access tube, or through both. A doctor who specializes in anesthesia (anesthesiologist) or a nurse who specializes in anesthesia (nurse anesthetist) or both will stay with you throughout the procedure to make sure you remain unconscious. He or she will also watch your blood pressure, pulse, and oxygen levels to make sure that the anesthetics do not cause any problems. Once you are asleep, a breathing tube or mask may be used to help you breathe. AFTER THE PROCEDURE You will wake up after the procedure is complete. You may be in the room where the procedure was performed or in a recovery area. You may have a sore throat if  a breathing tube was used. You may also feel:  Dizzy.  Weak.  Drowsy.  Confused.  Nauseous.  Cold. These are all normal responses and can be expected to last for up to 24 hours after the procedure is complete. A  caregiver will tell you when you are ready to go home. This will usually be when you are fully awake and in stable condition. Document Released: 07/11/2007 Document Revised: 08/18/2013 Document Reviewed: 08/02/2011 [facility] Patient Information 2015 [place], [place]. This information is not intended to replace advice given to you by your health care provider. Make sure you discuss any questions you have with your health care provider.

## 2014-10-07 NOTE — Transfer of Care (Signed)
Immediate Anesthesia Transfer of Care Note  Patient: Elizabeth Zhang  Procedure(s) Performed: Procedure(s): DILATATION AND CURETTAGE /HYSTEROSCOPY (N/A)  Patient Location: PACU  Anesthesia Type:General  Level of Consciousness: awake, alert  and patient cooperative  Airway & Oxygen Therapy: Patient Spontanous Breathing  Post-op Assessment: Report given to RN and Post -op Vital signs reviewed and stable  Post vital signs: Reviewed and stable  Last Vitals:  Filed Vitals:   10/07/14 1616  BP: 144/96  Pulse: 110  Temp: 36.3 C  Resp: 27    Complications: No apparent anesthesia complications

## 2014-10-07 NOTE — Anesthesia Procedure Notes (Signed)
Procedure Name: LMA Insertion Date/Time: 10/07/2014 3:10 PM Performed by: Courtney Paris Pre-anesthesia Checklist: Patient identified, Emergency Drugs available, Suction available, Patient being monitored and Timeout performed Patient Re-evaluated:Patient Re-evaluated prior to inductionOxygen Delivery Method: Circle system utilized and Simple face mask Preoxygenation: Pre-oxygenation with 100% oxygen Intubation Type: Combination inhalational/ intravenous induction Ventilation: Mask ventilation without difficulty LMA: LMA inserted LMA Size: 4.0 Grade View: Grade II Tube type: Oral Tube size: 4.0 mm Number of attempts: 1 Tube secured with: Tape Dental Injury: Teeth and Oropharynx as per pre-operative assessment

## 2014-10-07 NOTE — Op Note (Signed)
   Operative Note  10/07/2014 4:03 PM  PRE-OP DIAGNOSIS: HIGH GRADE CERVICAL DYSPLASIA, ABNORMAL UTERINE BLEEDING    POST-OP DIAGNOSIS: SAME  SURGEON: Surgeon(s) and Role:    * Mellody Drown, MD - Primary  ANESTHESIA: General   PROCEDURE: DILATATION AND CURETTAGE /HYSTEROSCOPY   ESTIMATED BLOOD LOSS: less than 50 mL  DRAINS: NONE  SPECIMENS: ECC, EMB  COMPLICATIONS: NONE  DISPOSITION: PACU - hemodynamically stable.   INDICATIONS: HGSIL PAP and abnormal uterine bleeding  FINDINGS: Exam under anesthesia revealed a normal size uterus without palpable adnexal masses.  The vagina was very long and the cervix was completely flush with the vagina.  There was no visible or palpable evidence of invasive cancer.  The cervical os was easy to sound and was dilated to 23 Fr for introduction of the hysteroscope.  Hysteroscopic findings revealed some tissue fragments in the endometrial cavity.  No abnormalities were seen in the cervical canal.  The cervix was essentially non-existent due to the prior LEEP with a very patulous canal and no real cervix between the external os and the endometrial cavity.  In view of this and the very poor exposure due to the long vagina, I decided not to attempt a cone biopsy as this was not technically feasible.    PROCEDURE IN DETAIL: After informed consent was obtained, the patient was taken to the operating room where anesthesia was obtained without difficulty. The patient was positioned in the dorsal lithotomy position in [place].  Time-out was performed. The patient was examined under anesthesia, and the above findings were noted. She was prepped and draped in normal sterile fashion.  The patient's bladder was catheterized with an in and out catheter.    A weighted speculum and deaver were placed inside the patient's vagina. The posterior vagina was grasped with a single-toothed tenaculum. The uterine cavity sounded to 7 cm. The cervix was then  progressively dilated up to a size 23-French dilator. The diagnostic hysteroscope was introduced into the uterine cavity and the above findings were noted. The hysteroscope was removed and the uterine cavity was curreted until a gritty texture was noted. A small amount of tissue was obtained. An endocervical curretage was also performed.  Excellent hemostasis was noted. The speculum and all other instruments were removed from the patient's vagina. The patient tolerated the procedure well.  Sponge, lap and needle counts were correct x2.  The patient was taken to recovery room in excellent condition.  VTE prophylaxis: was ordered perioperatively.  Mellody Drown, MD

## 2014-10-07 NOTE — Interval H&P Note (Signed)
History and Physical Interval Note:  10/07/2014 1:06 PM  Elizabeth Zhang  has presented today for surgery, with the diagnosis of CERVICAL DYSPLASIA.  The various methods of treatment have been discussed with the patient and family. After consideration of risks, benefits and other options for treatment, the patient has consented to cold knife cone and hysteroscopy, D&C  as a surgical intervention.  The patient's history has been reviewed, patient examined, no change in status, stable for surgery.  I have reviewed the patient's chart and labs.  Questions were answered to the patient's satisfaction.   Mellody Drown

## 2014-10-07 NOTE — H&P (View-Only) (Signed)
Gynecologic Oncology Interval Note  Chief Complaint: HSIL PAP and abnormal uterine bleeding for preop evaluation.  Subjective:  Oncology Treatment History:  Elizabeth Zhang is a 49 y.o. female who is seen in consultation from Dr. Mick Sell for HGSIL pap smear from 06/18/14,   Elizabeth Zhang has had many abnormal pap smears of the cervix over the years; she has been treated with both cryo therapy and a LEEP procedure, done about 10 years ago.   Her most recent pap history is as follows: 06/18/14 HSIL and ECC lGSIL 02/24/14 Colpo : Bx: LGSIL, ECC: HGSIL 01/23/14 ASCUS  Elizabeth Zhang does endorse some post coital spotting for the past six months, as well as increasingly heavy periods for the past year. She now reports bleeding heavily for up to three weeks, using a pad or tampon almost every hour at its heaviest. She was recently started on Lysteda, which has shortened the bleeding to about two weeks. She has not had an endometrial biopsy done to her knowledge.   In terms of her other medical history, Elizabeth Zhang has a history of Crohn's disease, though states she is currently in remission. She had been on prednisone for nearly 12 years, and in 1998, required an exploratory laparotomy, large bowel resection and appendectomy with temporary colostomy, followed by a colostomy take down six weeks later. Most recently she had a small RV fistula, though has not yet pursued surgical management.  She does have some diarrhea.  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  Past Gynecologic History:  Menarche: 17 Menstrual details: Lasts up to 21 days days, heavy flow Menses regular: no Last Menstrual Period: 07/26/14 History of OCP/HRT use: No History of Abnormal pap: yes, HSIL, 3/16 Last pap: 3/16 History of STDs: The patient denies history of sexually transmitted disease. Contraception: none Sexually active: yes  Problem List: Patient  Active Problem List   Diagnosis Date Noted  . Abnormal uterine bleeding 09/16/2014  . HSIL (high grade squamous intraepithelial lesion) on Pap smear of cervix 09/16/2014   Past Medical History: Past Medical History  Diagnosis Date  . Crohn disease     1980  . Duplicated ureter, left   . Cesarean delivery delivered     Past Surgical History: Past Surgical History  Procedure Laterality Date  . Appendectomy    . Cholecystectomy      Family History: Family History  Problem Relation Age of Onset  . Heart attack Father     Had Triple Bypass  . Lung disease Mother     Double lung transplant 2011    Social History: History   Social History  . Marital Status: Married    Spouse Name: N/A  . Number of Children: N/A  . Years of Education: N/A   Occupational History  . Not on file.   Social History Main Topics  . Smoking status: Current Every Day Smoker -- 0.25 packs/day for 30 years    Types: Cigarettes  . Smokeless tobacco: Never Used  . Alcohol Use: No  . Drug Use: No  . Sexual Activity: Not on file   Other Topics Concern  . Not on file   Social History Narrative  . No narrative on file    Allergies: Allergies  Allergen Reactions  . Morphine Hives and Nausea And Vomiting  . Nickel Rash  . Penicillin G Rash    Current Medications: Current Outpatient Prescriptions  Medication Sig Dispense Refill  . ALPRAZolam (XANAX) 1 MG tablet Take by mouth.    . cetirizine (ZYRTEC) 10 MG tablet Take 1 tablet by mouth daily.    . ferrous sulfate 325 (65 FE) MG tablet Take 1 tablet by mouth daily.    . fluticasone (FLONASE) 50 MCG/ACT nasal spray Place 2 sprays into the nose daily.    Marland Kitchen omeprazole (PRILOSEC) 40 MG capsule Take 40 mg by mouth daily as needed.    . tranexamic acid (LYSTEDA) 650 MG TABS tablet Take 2 tablets by mouth 3 (three) times daily as needed.     No current facility-administered medications for this visit.     Review of Systems A comprehensive  review of systems was negative.  Objective:  Physical Examination:  BP 155/100 mmHg  Pulse 105  Temp(Src) 98.5 F (36.9 C) (Tympanic)  Resp 18  Ht 5' 1"  (1.549 m)  Wt 160 lb 11.5 oz (72.9 kg)  BMI 30.38 kg/m2  ECOG Performance Status: 0 - Asymptomatic  General appearance: alert, cooperative and appears stated age HEENT: PERRL, EOMI, sclera clear Neck: no thyroid enlargement or cervical adenopathy Lymph node survey: non-palpable nodes Cardiovascular: regular rate and rhythm, without murmurs, rubs or gallops Respiratory: clear to auscultation Abdomen: no palpable masses, no hernias, nontender, nondistended,bowel sounds present,without hepatosplenomegaly Back: inspection of back is normal Extremities: no lower extremity edema Skin exam - normal coloration and turgor, no rashes, no suspicious skin lesions noted. Neurological exam reveals alert, oriented, normal speech, no focal findings or movement disorder noted. Pelvic: deferred  Assessment:  Elizabeth Zhang is a 49 y.o. female with HSIL PAP with positive ECC and abnormal uterine bleeding with cervical stenosis. 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  Plan:   Problem List Items Addressed This Visit    Abnormal uterine bleeding   HSIL (high grade squamous intraepithelial lesion) on Pap smear of cervix - Primary     We discussed options for management including CKC given her HSIL Pap and positive ECC, as well as hysteroscopy, D&C given her abnormal uterine bleeding (unable to perform EMB in the office due to cervical stenosis). While she would otherwise be a good candidate for definitive surgical management (ie hysterectomy), she has a history of extensive abdominal adhesions, and therefore may not be a good candidate for laparoscopic surgery. She preferred to proceed with the CKC, and then proceed with more invasive surgery if invasive cancer was identified  in the uterus or cervix.   Therefore, we recommend a cold knife cone and hysteroscopy, D&C. Ms. Mazzaferro was consented for an exam under anesthesia, colposcopy, cold knife conization, and endocervical curettage. She was counseled on the risks of surgery including but not limited to: Bleeding (possibly requiring transfusion), Infection (possibly requiring antibiotics), damage to nearby organs: bowel, bladder, blood vessels, nerves, ureters, uterus, vagina, cervix, need for further surgery/procedures, delayed wound healing, Anesthesia risks, medical complications: thromboembolic events (blood clot to lung, brain, legs), pneumonia, heart attack, stroke, death, cervical incompetence resulting in fertility/pregnancy complications and recurrence of dysplasia.   Ms. Soulier's surgery was scheduled for 10/07/14: consents were signed today.  She was counseled to avoid unprotected intercourse between now and surgery, but will need a bHCG the day of surgery as she is not using any form of contraception.   Suggested return to clinic in 4-6 weeks after her surgery for a post-operative visit   The  patient's diagnosis, an outline of the further diagnostic and laboratory studies which will be required, the recommendation, and alternatives were discussed. All questions were answered to the patient's satisfaction.  Mellody Drown, MD  CC:  Mellody Drown, MD [facility] [place], [place] [address] [phone]

## 2014-10-08 ENCOUNTER — Encounter: Payer: Self-pay | Admitting: Obstetrics and Gynecology

## 2014-10-08 LAB — URINE CULTURE: CULTURE: NO GROWTH

## 2014-10-09 LAB — SURGICAL PATHOLOGY

## 2014-10-28 ENCOUNTER — Inpatient Hospital Stay: Payer: BLUE CROSS/BLUE SHIELD | Attending: Obstetrics and Gynecology | Admitting: Obstetrics and Gynecology

## 2014-10-28 ENCOUNTER — Encounter: Payer: Self-pay | Admitting: Obstetrics and Gynecology

## 2014-10-28 DIAGNOSIS — Z9889 Other specified postprocedural states: Secondary | ICD-10-CM

## 2014-10-28 DIAGNOSIS — R87613 High grade squamous intraepithelial lesion on cytologic smear of cervix (HGSIL): Secondary | ICD-10-CM

## 2014-10-28 NOTE — Progress Notes (Signed)
Gynecologic Oncology Interval Note  Chief Complaint: HSIL for postop evaluation.  Subjective:  Oncology Treatment History:  Elizabeth Zhang is a 49 y.o. female who is seen in consultation from Dr. Mick Sell and [place] for HGSIL pap smears.   Elizabeth Zhang has had many abnormal pap smears of the cervix over the years; she has been treated with both cryo therapy and a LEEP procedure, done about 10 years ago.   Her most recent pap history is as follows: 06/18/14 HSIL and ECC lGSIL 02/24/14 Colpo : Bx: LGSIL, ECC: HGSIL 01/23/14 ASCUS  Elizabeth Zhang does endorse some post coital spotting, as well as increasingly heavy periods for the past year. She was started on Lysteda, which has shortened the bleeding to about two weeks. She had not had an endometrial biopsy done to her knowledge.   In terms of her other medical history, Elizabeth Zhang has a history of Crohn's disease, though states she is currently in remission. She had been on prednisone for nearly 12 years, and in 1998, required an exploratory laparotomy, large bowel resection and appendectomy with temporary colostomy, followed by a colostomy take down six weeks later. Most recently she had a small RV fistula, though has not yet pursued surgical management.  She does have some diarrhea.  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  We did a D&C 10/07/14.  Cone biopsy was also planned, but was not technically feasible due to long vagina and flush cervix.  DIAGNOSIS:  A. ENDOMETRIUM; CURETTINGS:  - CHRONIC ENDOMETRITIS.  - MENSTRUAL TYPE ENDOMETRIUM WITH ABUNDANT BLOOD.  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  - FRAGMENTS OF ENDOCERVICAL GLANDULAR TISSUE WITH SQUAMOUS METAPLASIA.   B. 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   Past Gynecologic History:  Menarche: 17 Menstrual details:  Lasts up to 21 days days, heavy flow Menses regular: no Last Menstrual Period: 07/26/14 History of OCP/HRT use: No History of Abnormal pap: yes, HSIL, 3/16 Last pap: 3/16 History of STDs: The patient denies history of sexually transmitted disease. Contraception: none Sexually active: yes  Problem List: Patient Active Problem List   Diagnosis Date Noted  . Abnormal uterine bleeding 09/16/2014  . 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 CD (Crohn's disease) 08/06/2014  . 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 HGSIL (high grade squamous intraepithelial lesion) on Pap smear of cervix 08/06/2014  . 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 Crohn disease     1980  . Duplicated ureter, left   . Cesarean delivery delivered     Past Surgical History: Past Surgical History  Procedure Laterality Date  . Appendectomy    . Cholecystectomy    . Cesarean section [place]  . Colon resection N/A 1998  . Hysteroscopy w/d&c N/A 10/07/2014    Procedure: DILATATION AND CURETTAGE /HYSTEROSCOPY;  Surgeon: Mellody Drown, MD;  Location: [facility] ORS;  Service: Gynecology;  Laterality: N/A;    Family History: Family History  Problem Relation Age of Onset  . Heart attack Father     Had Triple Bypass  . Lung disease Mother     Double lung transplant 2011    Social History: History   Social History  . Marital Status: Married    Spouse  Name: N/A  . Number of Children: N/A  . Years of Education: N/A   Occupational History  . Not on file.   Social History Main Topics  . Smoking status: Current Every Day Smoker -- 0.25 packs/day for 30 years    Types: Cigarettes  . Smokeless tobacco: Never Used  . Alcohol Use: No  . Drug Use: No  . Sexual Activity: Not on file   Other Topics Concern  . Not on file   Social History Narrative    Allergies: Allergies  Allergen Reactions  . Morphine  Hives and Nausea And Vomiting  . Nickel Rash  . Penicillin G Rash    Current Medications: Current Outpatient Prescriptions  Medication Sig Dispense Refill  . ALPRAZolam (XANAX) 1 MG tablet Take by mouth at bedtime as needed. prn    . cetirizine (ZYRTEC) 10 MG tablet Take 1 tablet by mouth every morning.     . ferrous sulfate 325 (65 FE) MG tablet Take 1 tablet by mouth every morning.     . fluticasone (FLONASE) 50 MCG/ACT nasal spray Place 2 sprays into the nose every morning.     Marland Kitchen omeprazole (PRILOSEC) 40 MG capsule Take 40 mg by mouth daily as needed.    Marland Kitchen PROAIR HFA 108 (90 BASE) MCG/ACT inhaler Inhale 2 puffs into the lungs every 4 (four) hours as needed. Every 4 to 6 hours prn  3  . tranexamic acid (LYSTEDA) 650 MG TABS tablet Take 2 tablets by mouth 3 (three) times daily as needed (take during menstral cycle).      No current facility-administered medications for this visit.     Review of Systems A comprehensive review of systems was negative.  Objective:  Physical Examination:  BP 125/87 mmHg  Pulse 84  Temp(Src) 97.8 F (36.6 C) (Tympanic)  Resp 16  Wt 159 lb 6.3 oz (72.301 kg)  LMP 09/21/2014  ECOG Performance Status: 0 - Asymptomatic  General appearance: alert, cooperative and appears stated age HEENT: PERRL, EOMI, sclera clear Neck: no thyroid enlargement or cervical adenopathy Lymph node survey: non-palpable nodes Cardiovascular: regular rate and rhythm, without murmurs, rubs or gallops Respiratory: clear to auscultation Abdomen: no palpable masses, no hernias, nontender, nondistended,bowel sounds present,without hepatosplenomegaly Back: inspection of back is normal Extremities: no lower extremity edema Skin exam - normal coloration and turgor, no rashes, no suspicious skin lesions noted. Neurological exam reveals alert, oriented, normal speech, no focal findings or movement disorder noted. Pelvic: deferred  Assessment:  Elizabeth Zhang is a 49 y.o.  female with HSIL PAP and ECC and does not desire fertility. Therapeutic cone biopsy was attempted and is not technically possible, so hysterectomy is the best option to prevent progression to invasive cervical cancer. 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   History of Chron's Disease with large bowel resection and appendectomy with temporary colostomy, followed by a colostomy take down in the past.   Plan:   Problem List Items Addressed This Visit      Genitourinary   HSIL (high grade squamous intraepithelial lesion) on Pap smear of cervix - Primary     We discussed options for management including TLH given her HSIL Pap and ECC and inability to have a cone biopsy.  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  We recommend TLH/BS with ovarian  preservation unless a significant abnormality is present. She understands that there is a high chance of conversion to laparotomy in view of her prior surgery and she has an existing Pfannenstiel incision. Elizabeth Zhang's surgery was scheduled for 11/25/14: consents were signed today.     She was counseled on the risks of surgery including but not limited to: Bleeding (possibly requiring transfusion), Infection (possibly requiring antibiotics), damage to nearby organs: bowel, bladder, blood vessels, nerves, ureters, uterus, vagina, cervix, need for further surgery/procedures, delayed wound healing, Anesthesia risks, medical complications: thromboembolic events (blood clot to lung, brain, legs), pneumonia, heart attack, stroke, death, cervical incompetence resulting in fertility/pregnancy complications and recurrence of dysplasia.   Suggested return to clinic in 4-6 weeks after her surgery for a post-operative visit. The patient's diagnosis, an outline of the further diagnostic and laboratory studies which  will be required, the recommendation, and alternatives were discussed. All questions were answered to the patient's satisfaction.  Mellody Drown, MD  CC:  [address] [place], [place] [address] [phone]

## 2014-10-30 ENCOUNTER — Telehealth: Payer: Self-pay | Admitting: *Deleted

## 2014-10-30 NOTE — Telephone Encounter (Signed)
Left voice mail message for patient. Surgery date on 11/25/14. Will coordinate patient's care with provider from [facility] to assist with Dr. Fransisca Connors with the procedure.  Preop will be scheduled for 11/16/14 at 10 am in the [facility] 2nd floor, [address].

## 2014-11-02 NOTE — Telephone Encounter (Signed)
Patient returned call.  She states that she received my message regarding the preop dates/time and surgery date. She states that she "may have to move the surgery date. She is having difficulty getting this surgery date approved by her supervisor at work.  However, she will call our office back should this surgery date not work.

## 2014-11-04 NOTE — Telephone Encounter (Addendum)
Patient called back 11/03/14. Requesting to change surgery date to 12/23/14. This surgery date was changed, but I will need to coordinate this with [facility] as [facility] will assist with [facility].  The preop date is 12/10/14 at 830am. Pt given these new dates.

## 2014-11-16 ENCOUNTER — Other Ambulatory Visit: Payer: BLUE CROSS/BLUE SHIELD

## 2014-12-10 ENCOUNTER — Encounter
Admission: RE | Admit: 2014-12-10 | Discharge: 2014-12-10 | Disposition: A | Discharge status: Home / Self Care | Payer: BLUE CROSS/BLUE SHIELD | Source: Ambulatory Visit | Attending: Obstetrics and Gynecology | Admitting: Obstetrics and Gynecology

## 2014-12-10 DIAGNOSIS — Z01812 Encounter for preprocedural laboratory examination: Secondary | ICD-10-CM | POA: Diagnosis present

## 2014-12-10 LAB — CBC WITH DIFFERENTIAL/PLATELET
BASOS ABS: 0.1 10*3/uL (ref 0–0.1)
BASOS PCT: 1 %
Eosinophils Absolute: 0.4 10*3/uL (ref 0–0.7)
Eosinophils Relative: 3 %
HEMATOCRIT: 45.3 % (ref 35.0–47.0)
HEMOGLOBIN: 14.6 g/dL (ref 12.0–16.0)
Lymphocytes Relative: 10 %
Lymphs Abs: 1.5 10*3/uL (ref 1.0–3.6)
MCH: 29.4 pg (ref 26.0–34.0)
MCHC: 32.3 g/dL (ref 32.0–36.0)
MCV: 91 fL (ref 80.0–100.0)
MONOS PCT: 8 %
Monocytes Absolute: 1.2 10*3/uL — ABNORMAL HIGH (ref 0.2–0.9)
NEUTROS PCT: 78 %
Neutro Abs: 12.1 10*3/uL — ABNORMAL HIGH (ref 1.4–6.5)
Platelets: 353 10*3/uL (ref 150–440)
RBC: 4.98 MIL/uL (ref 3.80–5.20)
RDW: 14.7 % — ABNORMAL HIGH (ref 11.5–14.5)
WBC: 15.2 10*3/uL — ABNORMAL HIGH (ref 3.6–11.0)

## 2014-12-10 LAB — BASIC METABOLIC PANEL
ANION GAP: 7 (ref 5–15)
BUN: 12 mg/dL (ref 6–20)
CALCIUM: 8.7 mg/dL — AB (ref 8.9–10.3)
CO2: 22 mmol/L (ref 22–32)
Chloride: 110 mmol/L (ref 101–111)
Creatinine, Ser: 0.8 mg/dL (ref 0.44–1.00)
GFR calc non Af Amer: >60 mL/min (ref >60)
Glucose, Bld: 103 mg/dL — ABNORMAL HIGH (ref 65–99)
POTASSIUM: 3.8 mmol/L (ref 3.5–5.1)
Sodium: 139 mmol/L (ref 135–145)

## 2014-12-10 LAB — TYPE AND SCREEN
ABO/RH(D): B POS
Antibody Screen: NEGATIVE

## 2014-12-10 LAB — APTT: aPTT: 26 seconds (ref 24–36)

## 2014-12-10 LAB — PROTIME-INR
INR: 0.99
Prothrombin Time: 13.3 seconds (ref 11.4–15.0)

## 2014-12-10 NOTE — Patient Instructions (Addendum)
  Your procedure is scheduled on: Wednesday December 23, 2014. Report to Same Day Surgery. To find out your arrival time please call [phone] between 1PM - 3PM on Tuesday December 22, 2014.  Remember: Instructions that are not followed completely may result in serious medical risk, up to and including death, or upon the discretion of your surgeon and anesthesiologist your surgery may need to be rescheduled.    __x__ 1. Do not eat food or drink liquids after midnight. No gum chewing or hard candies.     __x__ 2. No Alcohol for 24 hours before or after surgery.   ____ 3. Bring all medications with you on the day of surgery if instructed.    __x__ 4. Notify your doctor if there is any change in your medical condition     (cold, fever, infections).     Do not wear jewelry, make-up, hairpins, clips or nail polish.  Do not wear lotions, powders, or perfumes. You may wear deodorant.  Do not shave 48 hours prior to surgery. Men may shave face and neck.  Do not bring valuables to the hospital.    [facility] is not responsible for any belongings or valuables.               Contacts, dentures or bridgework may not be worn into surgery.  Leave your suitcase in the car. After surgery it may be brought to your room.  For patients admitted to the hospital, discharge time is determined by your treatment team.   Patients discharged the day of surgery will not be allowed to drive home.    Please read over the following fact sheets that you were given:   [facility] Preparing for Surgery  _x___ Take these medicines the morning of surgery with A SIP OF WATER:    1. omeprazole (PRILOSEC)   _x___ Fleet Enema (as directed)   ____ Use CHG Soap as directed  __x__ Use inhalers on the day of surgery and bring to hospital.  ____ Stop metformin 2 days prior to surgery    ____ Take 1/2 of usual insulin dose the night before surgery and none on the morning of surgery.   ____ Stop  Coumadin/Plavix/aspirin on Does not apply.  _x___ Stop Anti-inflammatories on Does not apply.  Can take Tylenol for pain.   ____ Stop supplements until after surgery.    ____ Bring C-Pap to the hospital.

## 2014-12-10 NOTE — Pre-Procedure Instructions (Signed)
Spoke with Renita Papa at [facility] regarding H+P date of 10/28/14 and will be out of date.  Dr. Fransisca Connors will do H+P day of surgery.

## 2014-12-17 ENCOUNTER — Telehealth: Payer: Self-pay | Admitting: *Deleted

## 2014-12-17 ENCOUNTER — Other Ambulatory Visit: Payer: Self-pay | Admitting: *Deleted

## 2014-12-17 DIAGNOSIS — D72829 Elevated white blood cell count, unspecified: Secondary | ICD-10-CM

## 2014-12-17 DIAGNOSIS — Z01818 Encounter for other preprocedural examination: Secondary | ICD-10-CM

## 2014-12-17 NOTE — Telephone Encounter (Signed)
Patient is scheduled for hysterectomy on 12/23/14. preop labs demonstrated wbc. 15.2 (H). pt is recovering from a 2nd UTI in a month. dx with a UTI started on cipro and will finish the abx on Monday. Patient wanted to make md aware. Patient inquried whether repeat cbc needs to be performed. Pt stated that had to wait for a repeat cbc at last OR visit due to elevated wbc count. RN left msg with Dr. Fransisca Connors to see if md wants to repeat cbc or wbc count on Tuesday prior to procedure or on Wednesday morning.

## 2014-12-17 NOTE — Telephone Encounter (Signed)
Per verbal order Dr. Fransisca Connors. Patient needs to come back to the clinic on Tuesday morning for a repeat cbc. This will allow sufficient time to obtain a result and determine if surgery needs to be delayed or postponed.  Notified patient. She is aware to come on Tuesday 12/22/14 at 845am.

## 2014-12-22 ENCOUNTER — Inpatient Hospital Stay: Payer: BLUE CROSS/BLUE SHIELD | Attending: Obstetrics and Gynecology

## 2014-12-22 DIAGNOSIS — D72829 Elevated white blood cell count, unspecified: Secondary | ICD-10-CM

## 2014-12-22 DIAGNOSIS — D069 Carcinoma in situ of cervix, unspecified: Secondary | ICD-10-CM | POA: Insufficient documentation

## 2014-12-22 DIAGNOSIS — Z01818 Encounter for other preprocedural examination: Secondary | ICD-10-CM

## 2014-12-22 LAB — CBC WITH DIFFERENTIAL/PLATELET
Basophils Absolute: 0.1 10*3/uL (ref 0–0.1)
Basophils Relative: 1 %
EOS ABS: 0.6 10*3/uL (ref 0–0.7)
Eosinophils Relative: 7 %
HCT: 45.2 % (ref 35.0–47.0)
HEMOGLOBIN: 15.1 g/dL (ref 12.0–16.0)
LYMPHS ABS: 2 10*3/uL (ref 1.0–3.6)
LYMPHS PCT: 22 %
MCH: 29.8 pg (ref 26.0–34.0)
MCHC: 33.5 g/dL (ref 32.0–36.0)
MCV: 89 fL (ref 80.0–100.0)
MONOS PCT: 7 %
Monocytes Absolute: 0.6 10*3/uL (ref 0.2–0.9)
NEUTROS PCT: 63 %
Neutro Abs: 5.7 10*3/uL (ref 1.4–6.5)
Platelets: 298 10*3/uL (ref 150–440)
RBC: 5.08 MIL/uL (ref 3.80–5.20)
RDW: 14.2 % (ref 11.5–14.5)
WBC: 9.1 10*3/uL (ref 3.6–11.0)

## 2014-12-23 ENCOUNTER — Inpatient Hospital Stay
Admission: AD | Admit: 2014-12-23 | Discharge: 2014-12-25 | DRG: 740 | Disposition: A | Discharge status: Home / Self Care | Payer: BLUE CROSS/BLUE SHIELD | Source: Ambulatory Visit | Attending: Obstetrics & Gynecology | Admitting: Obstetrics & Gynecology

## 2014-12-23 ENCOUNTER — Encounter
Admission: AD | Disposition: A | Discharge status: Home / Self Care | Payer: Self-pay | Source: Ambulatory Visit | Attending: Obstetrics & Gynecology

## 2014-12-23 ENCOUNTER — Ambulatory Visit: Payer: BLUE CROSS/BLUE SHIELD | Admitting: Anesthesiology

## 2014-12-23 ENCOUNTER — Encounter: Payer: Self-pay | Admitting: *Deleted

## 2014-12-23 DIAGNOSIS — F17210 Nicotine dependence, cigarettes, uncomplicated: Secondary | ICD-10-CM | POA: Diagnosis present

## 2014-12-23 DIAGNOSIS — K9171 Accidental puncture and laceration of a digestive system organ or structure during a digestive system procedure: Secondary | ICD-10-CM | POA: Diagnosis not present

## 2014-12-23 DIAGNOSIS — S36509A Unspecified injury of unspecified part of colon, initial encounter: Secondary | ICD-10-CM

## 2014-12-23 DIAGNOSIS — K5090 Crohn's disease, unspecified, without complications: Secondary | ICD-10-CM | POA: Diagnosis present

## 2014-12-23 DIAGNOSIS — D069 Carcinoma in situ of cervix, unspecified: Secondary | ICD-10-CM | POA: Diagnosis present

## 2014-12-23 DIAGNOSIS — Y838 Other surgical procedures as the cause of abnormal reaction of the patient, or of later complication, without mention of misadventure at the time of the procedure: Secondary | ICD-10-CM | POA: Diagnosis not present

## 2014-12-23 DIAGNOSIS — N736 Female pelvic peritoneal adhesions (postinfective): Secondary | ICD-10-CM | POA: Diagnosis present

## 2014-12-23 DIAGNOSIS — N7093 Salpingitis and oophoritis, unspecified: Secondary | ICD-10-CM | POA: Diagnosis present

## 2014-12-23 DIAGNOSIS — Z90722 Acquired absence of ovaries, bilateral: Secondary | ICD-10-CM

## 2014-12-23 DIAGNOSIS — Y92234 Operating room of hospital as the place of occurrence of the external cause: Secondary | ICD-10-CM

## 2014-12-23 DIAGNOSIS — Z90710 Acquired absence of both cervix and uterus: Secondary | ICD-10-CM

## 2014-12-23 DIAGNOSIS — Z9079 Acquired absence of other genital organ(s): Secondary | ICD-10-CM

## 2014-12-23 DIAGNOSIS — R87613 High grade squamous intraepithelial lesion on cytologic smear of cervix (HGSIL): Secondary | ICD-10-CM

## 2014-12-23 HISTORY — PX: LAPAROSCOPIC ASSISTED VAGINAL HYSTERECTOMY: SHX5398

## 2014-12-23 HISTORY — PX: CYSTOSCOPY: SHX5120

## 2014-12-23 HISTORY — PX: ABDOMINAL HYSTERECTOMY: SHX81

## 2014-12-23 LAB — CBC
HEMATOCRIT: 36.6 % (ref 35.0–47.0)
HEMOGLOBIN: 12 g/dL (ref 12.0–16.0)
MCH: 29.7 pg (ref 26.0–34.0)
MCHC: 32.6 g/dL (ref 32.0–36.0)
MCV: 91 fL (ref 80.0–100.0)
Platelets: 300 10*3/uL (ref 150–440)
RBC: 4.02 MIL/uL (ref 3.80–5.20)
RDW: 14.4 % (ref 11.5–14.5)
WBC: 35.7 10*3/uL — AB (ref 3.6–11.0)

## 2014-12-23 LAB — CREATININE, SERUM: CREATININE: 0.93 mg/dL (ref 0.44–1.00)

## 2014-12-23 LAB — POCT PREGNANCY, URINE: Preg Test, Ur: NEGATIVE

## 2014-12-23 SURGERY — HYSTERECTOMY, VAGINAL, LAPAROSCOPY-ASSISTED
Anesthesia: General

## 2014-12-23 MED ORDER — HYDROMORPHONE HCL 1 MG/ML IJ SOLN
INTRAMUSCULAR | Status: AC
  Filled 2014-12-23: qty 1

## 2014-12-23 MED ORDER — CIPROFLOXACIN IN D5W 400 MG/200ML IV SOLN
INTRAVENOUS | Status: AC
  Filled 2014-12-23: qty 200

## 2014-12-23 MED ORDER — ZOLPIDEM TARTRATE 5 MG PO TABS: 5.0000 mg | ORAL_TABLET | Freq: Every evening | ORAL | Status: DC | PRN

## 2014-12-23 MED ORDER — PHENYLEPHRINE HCL 10 MG/ML IJ SOLN
INTRAMUSCULAR | Status: DC | PRN
  Administered 2014-12-23 (×4): 100 ug via INTRAVENOUS
  Administered 2014-12-23 (×2): 200 ug via INTRAVENOUS
  Administered 2014-12-23: 100 ug via INTRAVENOUS

## 2014-12-23 MED ORDER — KETOROLAC TROMETHAMINE 15 MG/ML IJ SOLN
15.0000 mg | Freq: Four times a day (QID) | INTRAMUSCULAR | Status: DC
  Administered 2014-12-23: 15 mg via INTRAVENOUS
  Filled 2014-12-23: qty 1

## 2014-12-23 MED ORDER — ACETAMINOPHEN 10 MG/ML IV SOLN
INTRAVENOUS | Status: DC | PRN
  Administered 2014-12-23: 1000 mg via INTRAVENOUS

## 2014-12-23 MED ORDER — FLUORESCEIN SODIUM 10 % IJ SOLN
INTRAMUSCULAR | Status: DC | PRN
  Administered 2014-12-23: 50 mg via INTRAVENOUS

## 2014-12-23 MED ORDER — ONDANSETRON HCL 4 MG/2ML IJ SOLN
4.0000 mg | Freq: Four times a day (QID) | INTRAMUSCULAR | Status: DC | PRN
  Administered 2014-12-23: 4 mg via INTRAVENOUS
  Filled 2014-12-23: qty 2

## 2014-12-23 MED ORDER — LABETALOL HCL 5 MG/ML IV SOLN
INTRAVENOUS | Status: DC | PRN
  Administered 2014-12-23: 5 mg via INTRAVENOUS

## 2014-12-23 MED ORDER — ENOXAPARIN SODIUM 40 MG/0.4ML ~~LOC~~ SOLN
40.0000 mg | SUBCUTANEOUS | Status: DC
  Administered 2014-12-24 – 2014-12-25 (×2): 40 mg via SUBCUTANEOUS
  Filled 2014-12-23 (×3): qty 0.4

## 2014-12-23 MED ORDER — ONDANSETRON HCL 4 MG/2ML IJ SOLN: 4.0000 mg | Freq: Once | INTRAMUSCULAR | Status: DC | PRN

## 2014-12-23 MED ORDER — MEPERIDINE HCL 25 MG/ML IJ SOLN
25.0000 mg | INTRAMUSCULAR | Status: DC | PRN
  Administered 2014-12-23 – 2014-12-25 (×14): 25 mg via INTRAVENOUS
  Filled 2014-12-23 (×14): qty 1

## 2014-12-23 MED ORDER — ACETAMINOPHEN 10 MG/ML IV SOLN
INTRAVENOUS | Status: AC
  Filled 2014-12-23: qty 100

## 2014-12-23 MED ORDER — PROPOFOL 10 MG/ML IV BOLUS
INTRAVENOUS | Status: DC | PRN
  Administered 2014-12-23: 180 mg via INTRAVENOUS

## 2014-12-23 MED ORDER — ROCURONIUM BROMIDE 100 MG/10ML IV SOLN
INTRAVENOUS | Status: DC | PRN
  Administered 2014-12-23: 50 mg via INTRAVENOUS
  Administered 2014-12-23: 20 mg via INTRAVENOUS
  Administered 2014-12-23 (×2): 10 mg via INTRAVENOUS

## 2014-12-23 MED ORDER — DIPHENHYDRAMINE HCL 50 MG/ML IJ SOLN
INTRAMUSCULAR | Status: DC | PRN
  Administered 2014-12-23: 15 mg via INTRAVENOUS

## 2014-12-23 MED ORDER — FLUORESCEIN SODIUM 10 % IJ SOLN
INTRAMUSCULAR | Status: AC
  Filled 2014-12-23: qty 5

## 2014-12-23 MED ORDER — HYDROMORPHONE HCL 1 MG/ML IJ SOLN
INTRAMUSCULAR | Status: DC | PRN
  Administered 2014-12-23: 1 mg via INTRAVENOUS

## 2014-12-23 MED ORDER — FENTANYL CITRATE (PF) 100 MCG/2ML IJ SOLN
INTRAMUSCULAR | Status: DC | PRN
  Administered 2014-12-23: 100 ug via INTRAVENOUS
  Administered 2014-12-23: 150 ug via INTRAVENOUS
  Administered 2014-12-23 (×2): 100 ug via INTRAVENOUS
  Administered 2014-12-23: 50 ug via INTRAVENOUS

## 2014-12-23 MED ORDER — SODIUM CHLORIDE 0.9 % IV SOLN
INTRAVENOUS | Status: DC
  Administered 2014-12-23 – 2014-12-25 (×3): via INTRAVENOUS

## 2014-12-23 MED ORDER — FLEET ENEMA 7-19 GM/118ML RE ENEM: 1.0000 | ENEMA | Freq: Once | RECTAL | Status: DC

## 2014-12-23 MED ORDER — METHYLENE BLUE 1 % INJ SOLN
INTRAMUSCULAR | Status: AC
  Filled 2014-12-23: qty 10

## 2014-12-23 MED ORDER — ONDANSETRON HCL 4 MG/2ML IJ SOLN
INTRAMUSCULAR | Status: DC | PRN
  Administered 2014-12-23: 4 mg via INTRAVENOUS

## 2014-12-23 MED ORDER — PANTOPRAZOLE SODIUM 40 MG IV SOLR
40.0000 mg | Freq: Every day | INTRAVENOUS | Status: DC
  Administered 2014-12-23 – 2014-12-24 (×2): 40 mg via INTRAVENOUS
  Filled 2014-12-23 (×3): qty 40

## 2014-12-23 MED ORDER — LACTATED RINGERS IV SOLN: INTRAVENOUS | Status: DC

## 2014-12-23 MED ORDER — LIDOCAINE HCL (CARDIAC) 20 MG/ML IV SOLN
INTRAVENOUS | Status: DC | PRN
  Administered 2014-12-23: 100 mg via INTRAVENOUS

## 2014-12-23 MED ORDER — FENTANYL CITRATE (PF) 100 MCG/2ML IJ SOLN
INTRAMUSCULAR | Status: AC
  Administered 2014-12-23: 25 ug via INTRAVENOUS
  Filled 2014-12-23: qty 2

## 2014-12-23 MED ORDER — METRONIDAZOLE IN NACL 5-0.79 MG/ML-% IV SOLN
500.0000 mg | INTRAVENOUS | Status: AC
  Administered 2014-12-23: 500 mg via INTRAVENOUS
  Filled 2014-12-23: qty 100

## 2014-12-23 MED ORDER — SIMETHICONE 80 MG PO CHEW
80.0000 mg | CHEWABLE_TABLET | Freq: Four times a day (QID) | ORAL | Status: DC | PRN
  Administered 2014-12-25: 80 mg via ORAL
  Filled 2014-12-23: qty 1

## 2014-12-23 MED ORDER — CIPROFLOXACIN IN D5W 400 MG/200ML IV SOLN
400.0000 mg | INTRAVENOUS | Status: AC
  Administered 2014-12-23: 400 mg via INTRAVENOUS

## 2014-12-23 MED ORDER — ONDANSETRON HCL 4 MG PO TABS: 4.0000 mg | ORAL_TABLET | Freq: Four times a day (QID) | ORAL | Status: DC | PRN

## 2014-12-23 MED ORDER — ACETAMINOPHEN 325 MG PO TABS
650.0000 mg | ORAL_TABLET | ORAL | Status: DC
  Administered 2014-12-23 – 2014-12-25 (×10): 650 mg via ORAL
  Filled 2014-12-23 (×10): qty 2

## 2014-12-23 MED ORDER — LACTATED RINGERS IV SOLN
INTRAVENOUS | Status: DC
  Administered 2014-12-23 (×3): via INTRAVENOUS

## 2014-12-23 MED ORDER — PANTOPRAZOLE SODIUM 40 MG PO TBEC: 40.0000 mg | DELAYED_RELEASE_TABLET | Freq: Every day | ORAL | Status: DC

## 2014-12-23 MED ORDER — MIDAZOLAM HCL 2 MG/2ML IJ SOLN
INTRAMUSCULAR | Status: DC | PRN
  Administered 2014-12-23: 2 mg via INTRAVENOUS

## 2014-12-23 MED ORDER — BOOST / RESOURCE BREEZE PO LIQD
1.0000 | Freq: Three times a day (TID) | ORAL | Status: DC
  Administered 2014-12-23 – 2014-12-25 (×6): 1 via ORAL

## 2014-12-23 MED ORDER — TRAMADOL HCL 50 MG PO TABS
50.0000 mg | ORAL_TABLET | Freq: Four times a day (QID) | ORAL | Status: DC | PRN
  Administered 2014-12-23 – 2014-12-25 (×6): 50 mg via ORAL
  Filled 2014-12-23 (×6): qty 1

## 2014-12-23 MED ORDER — MENTHOL 3 MG MT LOZG: 1.0000 | LOZENGE | OROMUCOSAL | Status: DC | PRN

## 2014-12-23 MED ORDER — GLYCOPYRROLATE 0.2 MG/ML IJ SOLN
INTRAMUSCULAR | Status: DC | PRN
  Administered 2014-12-23: .5 mg via INTRAVENOUS

## 2014-12-23 MED ORDER — NEOSTIGMINE METHYLSULFATE 10 MG/10ML IV SOLN
INTRAVENOUS | Status: DC | PRN
  Administered 2014-12-23: 3 mg via INTRAVENOUS

## 2014-12-23 MED ORDER — CHLORHEXIDINE GLUCONATE 4 % EX LIQD: 1.0000 "application " | Freq: Once | CUTANEOUS | Status: DC

## 2014-12-23 MED ORDER — BUTORPHANOL TARTRATE 1 MG/ML IJ SOLN: 1.0000 mg | INTRAMUSCULAR | Status: DC | PRN

## 2014-12-23 MED ORDER — FENTANYL CITRATE (PF) 100 MCG/2ML IJ SOLN
25.0000 ug | INTRAMUSCULAR | Status: DC | PRN
  Administered 2014-12-23 (×4): 25 ug via INTRAVENOUS

## 2014-12-23 MED ORDER — IBUPROFEN 600 MG PO TABS: 600.0000 mg | ORAL_TABLET | Freq: Four times a day (QID) | ORAL | Status: DC

## 2014-12-23 MED ORDER — DEXAMETHASONE SODIUM PHOSPHATE 4 MG/ML IJ SOLN
INTRAMUSCULAR | Status: DC | PRN
  Administered 2014-12-23: 5 mg via INTRAVENOUS

## 2014-12-23 MED ORDER — IBUPROFEN 600 MG PO TABS
600.0000 mg | ORAL_TABLET | Freq: Four times a day (QID) | ORAL | Status: DC
  Administered 2014-12-23 – 2014-12-25 (×6): 600 mg via ORAL
  Filled 2014-12-23 (×5): qty 1

## 2014-12-23 MED ORDER — SODIUM CHLORIDE 0.9 % IV SOLN
Freq: Once | INTRAVENOUS | Status: AC
  Administered 2014-12-23: 16:00:00 via INTRAVENOUS

## 2014-12-23 SURGICAL SUPPLY — 101 items
APPLICATOR SURGIFLO (MISCELLANEOUS) IMPLANT
BAG URO DRAIN 2000ML W/SPOUT (MISCELLANEOUS) ×4 IMPLANT
BLADE SURG 15 STRL LF DISP TIS (BLADE) ×2 IMPLANT
BLADE SURG 15 STRL SS (BLADE) ×2
BLADE SURG 15 STRL SS SAFETY (BLADE) IMPLANT
BLADE SURG SZ10 CARB STEEL (BLADE) ×8 IMPLANT
CANISTER SUCT 1200ML W/VALVE (MISCELLANEOUS) ×4 IMPLANT
CANNULA DILATOR  5MM W/SLV (CANNULA)
CANNULA DILATOR 12 W/SLV (CANNULA) IMPLANT
CANNULA DILATOR 12MM W/SLV (CANNULA)
CANNULA DILATOR 5 W/SLV (CANNULA) IMPLANT
CATH FOLEY 2WAY  5CC 16FR (CATHETERS) ×2
CATH TRAY 16F METER LATEX (MISCELLANEOUS) IMPLANT
CATH URTH 16FR FL 2W BLN LF (CATHETERS) ×2 IMPLANT
CHLORAPREP W/TINT 26ML (MISCELLANEOUS) ×4 IMPLANT
CNTNR SPEC 2.5X3XGRAD LEK (MISCELLANEOUS) ×2
CONT SPEC 4OZ STER OR WHT (MISCELLANEOUS) ×2
CONTAINER SPEC 2.5X3XGRAD LEK (MISCELLANEOUS) ×2 IMPLANT
CORD MONOPOLAR M/FML 12FT (MISCELLANEOUS) ×4 IMPLANT
DEFOGGER SCOPE WARMER CLEARIFY (MISCELLANEOUS) ×4 IMPLANT
DEVICE SUTURE ENDOST 10MM (ENDOMECHANICALS) IMPLANT
DILATOR AND CANNULA WITH RADIALLY EXPANDABLE SLEEVE ×4 IMPLANT
DRAPE LAP W/FLUID (DRAPES) ×4 IMPLANT
DRAPE LAPAROTOMY 100X77 ABD (DRAPES) IMPLANT
DRAPE LAPAROTOMY TRNSV 106X77 (MISCELLANEOUS) IMPLANT
DRAPE LEGGINS SURG 28X43 STRL (DRAPES) ×4 IMPLANT
DRAPE PERI LITHO V/GYN (MISCELLANEOUS) IMPLANT
DRAPE STERI POUCH LG 24X46 STR (DRAPES) ×8 IMPLANT
DRAPE UNDER BUTTOCK W/FLU (DRAPES) ×4 IMPLANT
DRSG TEGADERM 2-3/8X2-3/4 SM (GAUZE/BANDAGES/DRESSINGS) ×16 IMPLANT
DRSG TELFA 3X8 NADH (GAUZE/BANDAGES/DRESSINGS) IMPLANT
ELECT BLADE 6 FLAT ULTRCLN (ELECTRODE) IMPLANT
ELECT BLADE 6.5 EXT (BLADE) ×4 IMPLANT
ELECT CAUTERY BLADE 6.4 (BLADE) IMPLANT
GAUZE SPONGE 4X4 12PLY STRL (GAUZE/BANDAGES/DRESSINGS) IMPLANT
GAUZE SPONGE NON-WVN 2X2 STRL (MISCELLANEOUS) IMPLANT
GLOVE BIO SURGEON STRL SZ7 (GLOVE) ×36 IMPLANT
GLOVE BIO SURGEON STRL SZ8 (GLOVE) ×24 IMPLANT
GLOVE INDICATOR 8.0 STRL GRN (GLOVE) ×12 IMPLANT
GOWN STRL REUS W/ TWL LRG LVL3 (GOWN DISPOSABLE) ×6 IMPLANT
GOWN STRL REUS W/ TWL XL LVL3 (GOWN DISPOSABLE) ×2 IMPLANT
GOWN STRL REUS W/TWL LRG LVL3 (GOWN DISPOSABLE) ×6
GOWN STRL REUS W/TWL XL LVL3 (GOWN DISPOSABLE) ×2
GRASPER ENDO ROTC 5X36 (INSTRUMENTS) ×4 IMPLANT
HANDLE YANKAUER SUCT BULB TIP (MISCELLANEOUS) ×4 IMPLANT
IRRIGATION STRYKERFLOW (MISCELLANEOUS) IMPLANT
IRRIGATOR STRYKERFLOW (MISCELLANEOUS)
IV LACTATED RINGERS 1000ML (IV SOLUTION) IMPLANT
KIT PINK PAD W/HEAD ARE REST (MISCELLANEOUS) ×4
KIT PINK PAD W/HEAD ARM REST (MISCELLANEOUS) ×2 IMPLANT
KIT RM TURNOVER CYSTO AR (KITS) ×4 IMPLANT
LABEL OR SOLS (LABEL) IMPLANT
LIGASURE BLUNT 5MM 37CM (INSTRUMENTS) ×4 IMPLANT
LIGASURE IMPACT 36 18CM CVD LR (INSTRUMENTS) IMPLANT
LIQUID BAND (GAUZE/BANDAGES/DRESSINGS) ×8 IMPLANT
MANIPULATOR VCARE LG CRV RETR (MISCELLANEOUS) IMPLANT
MANIPULATOR VCARE STD CRV RETR (MISCELLANEOUS) IMPLANT
NDL INSUFF ACCESS 14 VERSASTEP (NEEDLE) ×4 IMPLANT
NEEDLE SPNL 22GX5 LNG QUINC BK (NEEDLE) ×8 IMPLANT
NEEDLE VERESS 14GA 120MM (NEEDLE) IMPLANT
NS IRRIG 1000ML POUR BTL (IV SOLUTION) IMPLANT
NS IRRIG 500ML POUR BTL (IV SOLUTION) ×4 IMPLANT
OCCLUDER COLPOPNEUMO (BALLOONS) ×4 IMPLANT
PACK BASIN MAJOR ARMC (MISCELLANEOUS) IMPLANT
PACK GYN LAPAROSCOPIC (MISCELLANEOUS) ×4 IMPLANT
PAD GROUND ADULT SPLIT (MISCELLANEOUS) ×4 IMPLANT
PAD OB MATERNITY 4.3X12.25 (PERSONAL CARE ITEMS) ×4 IMPLANT
PAD PREP 24X41 OB/GYN DISP (PERSONAL CARE ITEMS) ×4 IMPLANT
PENCIL ELECTRO HAND CTR (MISCELLANEOUS) ×4 IMPLANT
POUCH ENDO CATCH II 15MM (MISCELLANEOUS) IMPLANT
SET CYSTO W/LG BORE CLAMP LF (SET/KITS/TRAYS/PACK) ×8 IMPLANT
SHEARS ENDO 5MM 31CM (CUTTER) ×4 IMPLANT
SLEEVE ENDOPATH XCEL 5M (ENDOMECHANICALS) ×8 IMPLANT
SPOGE SURGIFLO 8M (HEMOSTASIS)
SPONGE LAP 18X18 5 PK (GAUZE/BANDAGES/DRESSINGS) ×24 IMPLANT
SPONGE LAP 4X18 5PK (MISCELLANEOUS) IMPLANT
SPONGE SURGIFLO 8M (HEMOSTASIS) IMPLANT
SPONGE VERSALON 2X2 STRL (MISCELLANEOUS)
STAPLER SKIN PROX 35W (STAPLE) ×4 IMPLANT
SUT ENDO VLOC 180-0-8IN (SUTURE) IMPLANT
SUT MAXON ABS #0 GS21 30IN (SUTURE) IMPLANT
SUT PDS AB 1 TP1 96 (SUTURE) IMPLANT
SUT PLAIN 2 0 XLH (SUTURE) IMPLANT
SUT SILK 3-0 (SUTURE) ×4 IMPLANT
SUT VIC AB 0 CT1 27 (SUTURE) ×4
SUT VIC AB 0 CT1 27XCR 8 STRN (SUTURE) ×4 IMPLANT
SUT VIC AB 0 CT1 36 (SUTURE) ×28 IMPLANT
SUT VIC AB 2-0 BRD 54 (SUTURE) ×4 IMPLANT
SUT VIC AB 2-0 UR6 27 (SUTURE) IMPLANT
SUT VICRYL 0 AB UR-6 (SUTURE) ×4 IMPLANT
SUT VICRYL AB 3-0 FS1 BRD 27IN (SUTURE) IMPLANT
SYR 3ML LL SCALE MARK (SYRINGE) ×8 IMPLANT
SYR 50ML LL SCALE MARK (SYRINGE) ×8 IMPLANT
SYR BULB IRRIG 60ML STRL (SYRINGE) IMPLANT
SYRINGE 10CC LL (SYRINGE) ×4 IMPLANT
TRAY PREP VAG/GEN (MISCELLANEOUS) ×4 IMPLANT
TROCAR BLUNT TIP 12MM OMST12BT (TROCAR) ×8 IMPLANT
TROCAR VERSASTEP PLUS 12MM (TROCAR) ×4 IMPLANT
TROCAR VERSASTEP PLUS 5MM (TROCAR) ×4 IMPLANT
TROCAR XCEL NON-BLD 5MMX100MML (ENDOMECHANICALS) ×8 IMPLANT
TUBING INSUFFLATOR HEATED (MISCELLANEOUS) ×4 IMPLANT

## 2014-12-23 NOTE — Anesthesia Preprocedure Evaluation (Addendum)
Anesthesia Evaluation  Patient identified by MRN, date of birth, ID band Patient awake    Reviewed: Allergy & Precautions, NPO status , Patient's Chart, lab work & pertinent test results, reviewed documented beta blocker date and time   Airway Mallampati: II  TM Distance: >3 FB     Dental  (+) Chipped, Partial Upper   Pulmonary Current Smoker,           Cardiovascular      Neuro/Psych    GI/Hepatic   Endo/Other    Renal/GU      Musculoskeletal   Abdominal   Peds  Hematology   Anesthesia Other Findings   Reproductive/Obstetrics                            Anesthesia Physical Anesthesia Plan  ASA: II  Anesthesia Plan: General   Post-op Pain Management:    Induction: Intravenous  Airway Management Planned: Oral ETT  Additional Equipment:   Intra-op Plan:   Post-operative Plan:   Informed Consent: I have reviewed the patients History and Physical, chart, labs and discussed the procedure including the risks, benefits and alternatives for the proposed anesthesia with the patient or authorized representative who has indicated his/her understanding and acceptance.     Plan Discussed with: CRNA  Anesthesia Plan Comments:         Anesthesia Quick Evaluation

## 2014-12-23 NOTE — Op Note (Signed)
12/23/2014  1:15 PM  PATIENT:  Elizabeth Zhang  49 y.o. female  PRE-OPERATIVE DIAGNOSIS:  ABNORMAL PAPSMEAR  POST-OPERATIVE DIAGNOSIS:  abnormal pap smear  PROCEDURE:  Repair of full thickness colon injury  SURGEON:  Surgeon(s) and Role: Hortencia Conradi, MD FACS   Description of procedure:   This is an intraoperative consultation.  A 1-1/2 cm transversely oriented full thickness rectosigmoid injury was encountered by the gynecologist. I was asked for intraoperative consultation.  The wound appeared to be clean there was no significant spillage that I can appreciate at the time of my arrival.  I then proceeded to perform a 2 layer closure consisting of an inner layer of #3-0 locking PDS followed by interrupted 3-0 seromuscular Lembert type silk sutures.  It should be noted bowel was rather thickened in this area. It also should be noted that it was just distal to the area of the prior anastomosis which could be felt intraluminally.  An adjacent epiploic fat was able to be laid over the repair. A tongue of omentum was then sutured over the repair as well as a modified Graham patch. The remaining portion of the operation was performed by the gynecological team.

## 2014-12-23 NOTE — Transfer of Care (Signed)
Immediate Anesthesia Transfer of Care Note  Patient: Elizabeth Zhang  Procedure(s) Performed: Procedure(s): LAPAROSCOPIC ASSISTED VAGINAL HYSTERECTOMY, attempted (N/A) HYSTERECTOMY ABDOMINAL REPAIR OF COLOTOMY (N/A) CYSTOSCOPY  Patient Location: PACU  Anesthesia Type:General  Level of Consciousness: awake, alert , oriented and patient cooperative  Airway & Oxygen Therapy: Patient Spontanous Breathing and Patient connected to nasal cannula oxygen  Post-op Assessment: Report given to RN and Post -op Vital signs reviewed and stable  Post vital signs: Reviewed and stable  Last Vitals:  Filed Vitals:   12/23/14 1145  BP: 122/76  Pulse: 115  Temp: 36.4 C  Resp: 14    Complications: No apparent anesthesia complications

## 2014-12-23 NOTE — Anesthesia Procedure Notes (Signed)
Procedure Name: Intubation Date/Time: 12/23/2014 7:41 AM Performed by: Rosaria Ferries, Arissa Fagin Pre-anesthesia Checklist: Patient identified, Emergency Drugs available, Suction available and Patient being monitored Patient Re-evaluated:Patient Re-evaluated prior to inductionOxygen Delivery Method: Circle system utilized Preoxygenation: Pre-oxygenation with 100% oxygen Intubation Type: IV induction Laryngoscope Size: Mac and 3 Grade View: Grade I Tube type: Oral Tube size: 7.0 mm Number of attempts: 1 Secured at: 21 cm Tube secured with: Tape Dental Injury: Teeth and Oropharynx as per pre-operative assessment

## 2014-12-23 NOTE — H&P (Addendum)
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 We recommend TLH/BS with ovarian preservation unless a significant abnormality is present. She understands that there is a high chance of conversion to laparotomy in view of her prior surgery and she has an existing Pfannenstiel incision.   PE: lungs clear, Heart: regular rate and rhythm, Abdomen: soft, not tender  Surgery had to be rescheduled due to UTI with elevated white count.  WBC repeated and now normal. Patient prepared for surgery today and accompanied by family.  Mellody Drown, MD

## 2014-12-23 NOTE — Op Note (Signed)
Operative Note   12/23/2014 12:54 PM  PRE-OP DIAGNOSIS: Carcinoma in situ cervix   POST-OP DIAGNOSIS: same  SURGEON: Surgeon(s) and Role:    * Mellody Drown, MD - Primary    * Prerna Rad, MD - Co-Surgeon (only for TAVR and EVAR procedure)    * Honor Loh Ward, MD - Assisting  ANESTHESIA: General ET  PROCEDURE: DIAGNOSTIC LAPAROSCOPY WITH LYSIS OF ADHESIONS, CONVERTED TO EXPLORATORY LAPAROTOMY WITH TOTAL ABDOMINAL HYSTERECTOMY, BILATERAL SALPINGOOPHORECTOMY, CYSTOSOPY, ABDOMINAL REPAIR OF COLOTOMY  ESTIMATED BLOOD LOSS: 300 mL  DRAINS: Foley catheter  TOTAL IV FLUIDS: per Anesthesia  SPECIMENS: uterus, tubes and ovaries  COMPLICATIONS: colotomy  DISPOSITION: PACU - hemodynamically stable.  CONDITION: stable  INDICATIONS: CIS cervix for definitive treatment.   FINDINGS: On laparoscopy there were extensive adhesions of the omentum to the anterior abdominal wall and uterus and pelvic floor.  After 30 minutes of lysis of adhesions the visualization of the field was still poor and it was elected to convert to laparotomy via her old low transverse incision.  At laparotomy, the sigmoid colon was found to be densely adherent to the back of the uterus.  A colotomy occurred there despite using careful sharp dissection.  This was repaired in layers by Dr Marina Gravel from General Surgery.  The small bowel was not involved with adhesions.The ureter was identified on both sides, but a second ureter that she was reported to have on the left was not seen.  The uterus was normal.  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  The cystoscopy revealed good jets from both ureters.  A second orifice was not seen on the left.    PROCEDURE IN DETAIL: After informed consent was obtained, the patient was taken to the operating room where general endotracheal anesthesia was performed without difficulty. The patient was positioned in the dorsal  lithotomy position in [place] and the usual precautions taken with her arms tucked bilaterally. She was prepped and draped in normal sterile fashion. Time-out was performed and a Foley catheter was placed into the bladder. A standard V-Care uterine manipulator was placed without incident and a pneum-occluder balloon was also placed.    An open Hasson technique was used to place an infraumbilical 79-KW baloon trocar under direct visualization. The laparoscope was introduced and CO2 gas was infused for pneumoperitoneum to a pressure of 15 mm Hg.  Right and left lateral 5-mm ports and a 5-12 mm suprapubic port were placed under direct visualization of the laparoscope using an EndoStep technique.  Adhesions of the omentum to the anterior abdominal wall and pelvic organs were taken down with the ligasure, but after 30 minutes, it became obvious that there were dense adhesions posteriorly that could not be taken down laparoscopically.  So the decision was made to convert to laparotomy.    A low transverse incision was made and carried down to the underlying fascia.  The fascia was incised and reflected off the muscles anteriorly and posteriorly and the peritoneum was then entered. The exposure was still inadequate so the rectus tendon on the left was transected and the left inferior epigastric was ligated.  After the incision was extended the operative findings noted above were identified. The bowel was freed up and placed up into the upper abdomen, packed away and a Bookwalter abdominal retractor was then placed using just the rim without the arm and post.  Additional extensive lysis of adhesions of the omentum  was done to free up the adnexa and uterus.  The colon was densely adherent to the posterior uterus.   A colotomy occurred there despite using careful sharp dissection.  This was repaired by Dr Marina Gravel from General Surgery in two layers after the hysterectomy was completed.  The omentum was then sown over  the repair.    Round ligaments were then divided on each side with electrocautery and the retroperitoneal space was opened bilaterally. The ureters were identified and preserved. Only one ureter was noted on the left and this was mobilized away from the colon.  A second ureter was not found.  The infundibulopelvic ligaments were skeletonized up to where they were clearly going up towards the uterus, divided, then suture ligated. The bladder flap was created with difficulty with sharp dissection and electrocautery and the bladder was dissected down off the lower uterine segment and cervix. The uterine arteries were then skeletonized bilaterally clamped, cut, and divided, then suture ligated. The cardinal ligaments were then clamped cut and divided and suture ligated as well. The vaginal angles were then clamped with hysterectomy clamps, cut and suture ligated and tagged. The specimen was then amputated from the vagina using Jorgensen scissors and the vaginal walls were grasped with allis clamps. The vaginal cuff was then closed with 0 Vicryl suture in a figure-of-eight fashion with careful attention to include the vaginal cuff angles and the vaginal mucosa within the closure and avoid the bladder.   A cystoscopy was performed with florescent dye and spill was seen from both ureteral orifices.  A second ureteral orifice was not seen on the left. After the abdomen was irrigated and all pedicles felt to be hemostatic the laparotomy packs were removed and the left rectus tendon was reattached using three 0 Vicryl sutures. Because of the colotomy, we changed the drapes and repreped the skin edges and got a clean instrument tray for the closure.  The fascia was then reapproximated in a running closure using O Vicryl from each angle and tied in the middle.  The subcutaneous space was then irrigated, and the skin was closed with staples and a dressing was placed.  The umbilical incision was closed with a figure of eight  suture using 0 Vicryl.  The skin was glued as were the two lateral 5 mm ports.  The patient tolerated the procedure well. Sponge, lap, needle and instrument counts were correct x2 at the end of the procedure.   Antibiotics: Cipro/Flgyl was given prior to incision.    VTE prophylaxis: was ordered perioperatively.   Mellody Drown, MD

## 2014-12-24 LAB — CBC
HCT: 27.6 % — ABNORMAL LOW (ref 35.0–47.0)
Hemoglobin: 9 g/dL — ABNORMAL LOW (ref 12.0–16.0)
MCH: 29.5 pg (ref 26.0–34.0)
MCHC: 32.5 g/dL (ref 32.0–36.0)
MCV: 91 fL (ref 80.0–100.0)
PLATELETS: 197 10*3/uL (ref 150–440)
RBC: 3.03 MIL/uL — ABNORMAL LOW (ref 3.80–5.20)
RDW: 13.9 % (ref 11.5–14.5)
WBC: 16.4 10*3/uL — ABNORMAL HIGH (ref 3.6–11.0)

## 2014-12-24 LAB — BASIC METABOLIC PANEL
Anion gap: 5 (ref 5–15)
BUN: 14 mg/dL (ref 6–20)
CHLORIDE: 111 mmol/L (ref 101–111)
CO2: 24 mmol/L (ref 22–32)
CREATININE: 0.86 mg/dL (ref 0.44–1.00)
Calcium: 8 mg/dL — ABNORMAL LOW (ref 8.9–10.3)
GFR calc Af Amer: >60 mL/min (ref >60)
GFR calc non Af Amer: >60 mL/min (ref >60)
Glucose, Bld: 132 mg/dL — ABNORMAL HIGH (ref 65–99)
Potassium: 4 mmol/L (ref 3.5–5.1)
SODIUM: 140 mmol/L (ref 135–145)

## 2014-12-24 LAB — MAGNESIUM: MAGNESIUM: 2.1 mg/dL (ref 1.7–2.4)

## 2014-12-24 NOTE — Anesthesia Postprocedure Evaluation (Signed)
  Anesthesia Post-op Note  Patient: Elizabeth Zhang  Procedure(s) Performed: Procedure(s): LAPAROSCOPIC ASSISTED VAGINAL HYSTERECTOMY, attempted (N/A) HYSTERECTOMY ABDOMINAL REPAIR OF COLOTOMY (N/A) CYSTOSCOPY  Anesthesia type:General  Patient location: PACU  Post pain: Pain level controlled  Post assessment: Post-op Vital signs reviewed, Patient's Cardiovascular Status Stable, Respiratory Function Stable, Patent Airway and No signs of Nausea or vomiting  Post vital signs: Reviewed and stable  Last Vitals:  Filed Vitals:   12/24/14 0728  BP: 106/60  Pulse: 87  Temp: 36.8 C  Resp: 18    Level of consciousness: awake, alert  and patient cooperative  Complications: No apparent anesthesia complications

## 2014-12-24 NOTE — Progress Notes (Signed)
Obstetric and Gynecology  POD/HD # 1/2  Subjective  Patient doing well, no complaints, tolerating PO intake, tolerating pain, still has foley in place, requests removal.   Has not ambulated much due to foley but has been OOB to chair.  Denies CP, SOB, F/C, N/V/D, or leg pain.   Objective  Objective:   Filed Vitals:   12/24/14 0407 12/24/14 0728  BP: 92/48 106/60  Pulse: 81 87  Temp: 98.2 F (36.8 C) 98.3 F (36.8 C)  TempSrc: Oral Oral  Resp: 17 18  Height:    Weight:    SpO2: 91% 91%  I/O: 2000 (IV and PO) in 1400 out  General: NAD Cardiovascular: RRR, no murmurs Pulmonary: CTAB, normal respiratory effort Abdomen: Benign. Non-tender, +BS, no guarding. Incision: bandage c/d/i.  Laparosco[ic incisions c/d/i and covered in surgical glue Extremities: No erythema or cords, no calf tenderness, with normal peripheral pulses.  Labs: Pending collection   Assessment   49 y.o. No obstetric history on file. 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  Plan   1. Postop:  Doing great so far, needs foley removed.  Continue clears until tomorrow due to bowel repair. Follow up labs.  Continue routine post op care.  Bandage to be removed tomorrow unless otherwise indicated 2. CIS: follow up pathology results.  ----- Larey Days, MD Attending Obstetrician and Gynecologist [facility]  3.

## 2014-12-25 MED ORDER — TRAMADOL HCL 50 MG PO TABS: 50.0000 mg | ORAL_TABLET | Freq: Four times a day (QID) | ORAL | Status: DC | PRN

## 2014-12-25 MED ORDER — ACETAMINOPHEN 325 MG PO TABS: 650.0000 mg | ORAL_TABLET | ORAL | Status: DC

## 2014-12-25 MED ORDER — ENOXAPARIN SODIUM 300 MG/3ML IJ SOLN: 40.0000 mg | Freq: Every day | INTRAMUSCULAR | Status: DC

## 2014-12-25 MED ORDER — MEPERIDINE HCL 50 MG PO TABS: 50.0000 mg | ORAL_TABLET | ORAL | Status: DC | PRN

## 2014-12-25 MED ORDER — MEPERIDINE HCL 50 MG PO TABS: 100.0000 mg | ORAL_TABLET | ORAL | Status: DC | PRN

## 2014-12-25 MED ORDER — MEPERIDINE HCL 50 MG PO TABS
50.0000 mg | ORAL_TABLET | ORAL | Status: DC | PRN
  Administered 2014-12-25 (×2): 50 mg via ORAL
  Filled 2014-12-25 (×2): qty 1

## 2014-12-25 NOTE — Progress Notes (Signed)
Obstetric and Gynecology  POD/HD # 2/3  Subjective  Patient doing well, no complaints, tolerating PO intake, tolerating pain, voiding spontaneously, ambulated hallways without assistance.  + flatus, no significant complaints. Denies CP, SOB, F/C, N/V/D, or leg pain.   Objective  Objective:   BP 108/60 mmHg  Pulse 78  Temp(Src) 98.1 F (36.7 C) (Oral)  Resp 18  SpO2 94%    General: NAD Cardiovascular: RRR, no murmurs Pulmonary: CTAB, normal respiratory effort Abdomen: Benign. Non-tender, +BS, no guarding. Incision: bandage removed, incision c/d/i w staples in place..  Laparoscopic incisions c/d/i and covered in surgical glue Extremities: No erythema or cords, no calf tenderness, with normal peripheral pulses.  Labs: Results for orders placed or performed during the hospital encounter of 12/23/14 (from the past 24 hour(s))  CBC     Status: Abnormal   Collection Time: 12/24/14  9:19 PM  Result Value Ref Range   WBC 16.4 (H) 3.6 - 11.0 K/uL   RBC 3.03 (L) 3.80 - 5.20 MIL/uL   Hemoglobin 9.0 (L) 12.0 - 16.0 g/dL   HCT 27.6 (L) 35.0 - 47.0 %   MCV 91.0 80.0 - 100.0 fL   MCH 29.5 26.0 - 34.0 pg   MCHC 32.5 32.0 - 36.0 g/dL   RDW 13.9 11.5 - 14.5 %   Platelets 197 150 - 440 K/uL  Basic metabolic panel     Status: Abnormal   Collection Time: 12/24/14  9:19 PM  Result Value Ref Range   Sodium 140 135 - 145 mmol/L   Potassium 4.0 3.5 - 5.1 mmol/L   Chloride 111 101 - 111 mmol/L   CO2 24 22 - 32 mmol/L   Glucose, Bld 132 (H) 65 - 99 mg/dL   BUN 14 6 - 20 mg/dL   Creatinine, Ser 0.86 0.44 - 1.00 mg/dL   Calcium 8.0 (L) 8.9 - 10.3 mg/dL   GFR calc non Af Amer >60 >60 mL/min   GFR calc Af Amer >60 >60 mL/min   Anion gap 5 5 - 15  Magnesium     Status: None   Collection Time: 12/24/14  9:19 PM  Result Value Ref Range   Magnesium 2.1 1.7 - 2.4 mg/dL     Assessment   49 y.o. No obstetric history on file. 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  Plan   1. Postop:  Doing great so far, d/c IVF and remove IV.  Advance to regular diet.  Continue routine post op care.   2. CIS: prelim results are negative for malignancy or residual dysplasia.  TOA identified in specimen. 3. Leukocytosis:  Asymptomatic, and likely due to post op condition.  Also could be from manipulation of TOA, although sources and specimen have been removed. 4. Discharge planning:  Patient has requested to go home if she continues to meet post op goals.  I'd like to have her eat a regular meal and tolerate this before being discharged, which could be this evening if all goes well.  Will be in touch with nursing for report and dispo.  Anticipate d/c tonight. ----- Larey Days, MD Attending Obstetrician and Gynecologist [facility]  3.

## 2014-12-25 NOTE — Progress Notes (Signed)
Renne Crigler, RN called down to pharmacy to get a education tool for Lovenox for the patient to take home and for the nurse to educate the patient. Margarita Grizzle was informed by Minette Headland in the pharmacy at [facility] that they didn't have any kits that they haven't had any in a while.

## 2014-12-25 NOTE — Discharge Instructions (Signed)
Discharge instructions:   Call office if you have any of the following: headache, visual changes, fever >101.0 F, chills, breast concerns, excessive vaginal bleeding, incision drainage or problems, leg pain or redness, depression or any other concerns.   Activity: Do not lift > 10 lbs for 6 weeks.  No intercourse or tampons for 6 weeks.  No driving for 1-2 weeks.   Call your doctor for increased pain or vaginal bleeding, temperature above 101.0, or concerns.  No strenuous activity or heavy lifting for 6 weeks.  No intercourse, tampons, douching, or enemas for 6 weeks.  No tub baths-showers only.  No driving for 2 weeks or while taking pain medications.

## 2014-12-25 NOTE — Discharge Summary (Signed)
Gynecology Physician Postoperative Discharge Summary  Patient ID: Elizabeth Zhang MRN: [id number] DOB/AGE: 1966-04-11 49 y.o.  Admit Date: 12/23/2014 Discharge Date: 12/25/2014  Preoperative Diagnoses: Cervical Carcinoma Insitu Postoperative Diagnosis: Same, TuboOvarian Abscess  Procedures: Procedure(s) (LRB): LAPAROSCOPIC ASSISTED VAGINAL HYSTERECTOMY, attempted (N/A) HYSTERECTOMY ABDOMINAL REPAIR OF COLOTOMY (N/A) CYSTOSCOPY  CBC Latest Ref Rng 12/24/2014 12/23/2014 12/22/2014  WBC 3.6 - 11.0 K/uL 16.4(H) 35.7(H) 9.1  Hemoglobin 12.0 - 16.0 g/dL 9.0(L) 12.0 15.1  Hematocrit 35.0 - 47.0 % 27.6(L) 36.6 45.2  Platelets 150 - 440 K/uL 197 300 298    Hospital Course:  Elizabeth Zhang is a 49 y.o. admitted for scheduled surgery.  She underwent the procedures as mentioned above, her operation was uncomplicated. For further details about surgery, please refer to the operative report. Patient had an uncomplicated postoperative course. By time of discharge on POD#2, her pain was controlled on oral pain medications; she was ambulating, voiding without difficulty, tolerating regular diet and passing flatus. She was deemed stable for discharge to home.   Discharge Exam: Blood pressure 108/60, pulse 78, temperature 98.1 F (36.7 C), temperature source Oral, resp. rate 18, height 5' 1"  (1.549 m), weight 70.761 kg (156 lb), SpO2 94 %. General appearance: alert and no distress  Resp: clear to auscultation bilaterally, normal respiratory effort Cardio: regular rate and rhythm  GI: soft, non-tender; bowel sounds normal; no masses, no organomegaly.  Incision: C/D/I, no erythema, no drainage noted, staples in place Pelvic: scant blood on pad  Extremities: extremities normal, atraumatic, no cyanosis or edema and Homans sign is negative, no sign of DVT  Discharged Condition: Stable  Disposition: 01-Home or Self Care  Discharge Instructions    Call MD for:  hives    Complete by:  As directed      Call  MD for:  persistant nausea and vomiting    Complete by:  As directed      Call MD for:  redness, tenderness, or signs of infection (pain, swelling, redness, odor or green/yellow discharge around incision site)    Complete by:  As directed      Call MD for:  severe uncontrolled pain    Complete by:  As directed      Diet - low sodium heart healthy    Complete by:  As directed      Discharge wound care:    Complete by:  As directed   Keep incision dry and clean.  If you notice any thick or smelly discharge please see Dr. Leonides Schanz as soon as possible.     Increase activity slowly    Complete by:  As directed             Medication List    TAKE these medications        acetaminophen 325 MG tablet  Commonly known as:  TYLENOL  Take 2 tablets (650 mg total) by mouth every 4 (four) hours.     ALPRAZolam 1 MG tablet  Commonly known as:  XANAX  Take 1 mg by mouth at bedtime as needed for anxiety or sleep. prn     cetirizine 10 MG tablet  Commonly known as:  ZYRTEC  Take 1 tablet by mouth every morning.     enoxaparin 300 MG/3ML Soln injection  Commonly known as:  LOVENOX  Inject 0.4 mLs (40 mg total) into the skin daily.     fluticasone 50 MCG/ACT nasal spray  Commonly known as:  FLONASE  Place 2 sprays into the  nose every morning.     meperidine 50 MG tablet  Commonly known as:  DEMEROL  Take 1 tablet (50 mg total) by mouth every 4 (four) hours as needed for moderate pain or severe pain.     omeprazole 40 MG capsule  Commonly known as:  PRILOSEC  Take 40 mg by mouth daily as needed.     PROAIR HFA 108 (90 BASE) MCG/ACT inhaler  Generic drug:  albuterol  Inhale 2 puffs into the lungs every 4 (four) hours as needed for wheezing or shortness of breath. Every 4 to 6 hours prn     traMADol 50 MG tablet  Commonly known as:  ULTRAM  Take 1 tablet (50 mg total) by mouth every 6 (six) hours as needed for moderate pain.     tranexamic acid 650 MG Tabs tablet  Commonly known as:   LYSTEDA  Take 2 tablets by mouth 3 (three) times daily as needed (take during menstral cycle).           Follow-up Information    Follow up with Ward, Honor Loh, MD In 1 week.   Specialty:  Obstetrics and Gynecology   Why:  early next week for staple removal   Contact information:   [place] [address] [phone]       Follow up with Mellody Drown, MD In 4 weeks.   Specialty:  Obstetrics and Gynecology   Why:  at the [facility] for routine post operative care   Contact information:   [facility] [address] [place] [address] [phone]       Signed:  Maceo Pro Attending [facility]

## 2014-12-28 LAB — SURGICAL PATHOLOGY

## 2015-01-11 NOTE — Op Note (Signed)
12/23/2014 1:28 PM  PRE-OP DIAGNOSIS: Carcinoma in situ cervix  POST-OP DIAGNOSIS: same  SURGEON: Surgeon(s) and Role:  * Mellody Drown, MD - Primary  * Kadance Rad, MD - Co-Surgeon (only for TAVR and EVAR procedure)  * Honor Loh Ward, MD - Assisting  ANESTHESIA: General ET  PROCEDURE: DIAGNOSTIC LAPAROSCOPY WITH LYSIS OF ADHESIONS, CONVERTED TO EXPLORATORY LAPAROTOMY WITH TOTAL ABDOMINAL HYSTERECTOMY, BILATERAL SALPINGOOPHORECTOMY, CYSTOSOPY, ABDOMINAL REPAIR OF COLOTOMY  ESTIMATED BLOOD LOSS: 300 mL  DRAINS: Foley catheter  TOTAL IV FLUIDS: per Anesthesia  SPECIMENS: uterus, tubes and ovaries  COMPLICATIONS: colotomy  DISPOSITION: PACU - hemodynamically stable.  CONDITION: stable  INDICATIONS: CIS cervix for definitive treatment.   FINDINGS: On laparoscopy there were extensive adhesions of the omentum to the anterior abdominal wall and uterus and pelvic floor. After 30 minutes of lysis of adhesions the visualization of the field was still poor and it was elected to convert to laparotomy via her old low transverse incision. At laparotomy, the sigmoid colon was found to be densely adherent to the back of the uterus. A colotomy occurred there despite using careful sharp dissection. This was repaired in layers by Dr Marina Gravel from General Surgery. The small bowel was not involved with adhesions.The ureter was identified on both sides, but a second ureter that she was reported to have on the left was not seen. The uterus was normal. 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 The cystoscopy revealed good jets from both ureters. A second orifice was not seen on the left.   PROCEDURE IN DETAIL: After informed consent was obtained, the patient was taken to the operating room where general endotracheal anesthesia was performed without difficulty. The patient was positioned in the dorsal lithotomy position in [place] and the usual precautions taken with her arms tucked bilaterally. She was prepped and draped in normal sterile fashion. Time-out was performed and a Foley catheter was placed into the bladder. A standard V-Care uterine manipulator was placed without incident and a pneum-occluder balloon was also placed.   An open Hasson technique was used to place an infraumbilical 28-NO baloon trocar under direct visualization. The laparoscope was introduced and CO2 gas was infused for pneumoperitoneum to a pressure of 15 mm Hg. Right and left lateral 5-mm ports and a 5-12 mm suprapubic port were placed under direct visualization of the laparoscope using an EndoStep technique. Adhesions of the omentum to the anterior abdominal wall and pelvic organs were taken down with the ligasure, but after 30 minutes, it became obvious that there were dense adhesions posteriorly that could not be taken down laparoscopically. So the decision was made to convert to laparotomy.   A low transverse incision was made and carried down to the underlying fascia. The fascia was incised and reflected off the muscles anteriorly and posteriorly and the peritoneum was then entered. The exposure was still inadequate so the rectus tendon on the left was transected and the left inferior epigastric was ligated. After the incision was extended the operative findings noted above were identified. The bowel was freed up and placed up into the upper abdomen, packed away and a Bookwalter abdominal retractor was then placed using just the rim without the arm and post. Additional extensive lysis of adhesions of the omentum was done to free up the adnexa and uterus. The colon was densely adherent to the posterior uterus. A colotomy occurred there despite using careful sharp dissection. This was repaired  by Dr Marina Gravel from General Surgery in two layers after the hysterectomy was completed. The omentum was then sown over the repair.   Round  ligaments were then divided on each side with electrocautery and the retroperitoneal space was opened bilaterally. The ureters were identified and preserved. Only one ureter was noted on the left and this was mobilized away from the colon. A second ureter was not found. The infundibulopelvic ligaments were skeletonized up to where they were clearly going up towards the uterus, divided, then suture ligated. The bladder flap was created with difficulty with sharp dissection and electrocautery and the bladder was dissected down off the lower uterine segment and cervix. The uterine arteries were then skeletonized bilaterally clamped, cut, and divided, then suture ligated. The cardinal ligaments were then clamped cut and divided and suture ligated as well. The vaginal angles were then clamped with hysterectomy clamps, cut and suture ligated and tagged. The specimen was then amputated from the vagina using Jorgensen scissors and the vaginal walls were grasped with allis clamps. The vaginal cuff was then closed with 0 Vicryl suture in a figure-of-eight fashion with careful attention to include the vaginal cuff angles and the vaginal mucosa within the closure and avoid the bladder.   A cystoscopy was performed with florescent dye and spill was seen from both ureteral orifices. A second ureteral orifice was not seen on the left. After the abdomen was irrigated and all pedicles felt to be hemostatic the laparotomy packs were removed and the left rectus tendon was reattached using three 0 Vicryl sutures. Because of the colotomy, we changed the drapes and repreped the skin edges and got a clean instrument tray for the closure. The fascia was then reapproximated in a running closure using O Vicryl from each angle and tied in the middle. The subcutaneous space was then irrigated, and the skin was closed with staples and a dressing was placed. The umbilical incision was closed with a figure of eight suture using 0 Vicryl.  The skin was glued as were the two lateral 5 mm ports. The patient tolerated the procedure well. Sponge, lap, needle and instrument counts were correct x2 at the end of the procedure.   Antibiotics: Cipro/Flgyl was given prior to incision.    VTE prophylaxis: was ordered perioperatively.   Larey Days, MD

## 2015-01-13 ENCOUNTER — Inpatient Hospital Stay (HOSPITAL_BASED_OUTPATIENT_CLINIC_OR_DEPARTMENT_OTHER): Payer: BLUE CROSS/BLUE SHIELD | Admitting: Obstetrics and Gynecology

## 2015-01-13 ENCOUNTER — Other Ambulatory Visit: Payer: Self-pay | Admitting: *Deleted

## 2015-01-13 VITALS — BP 114/78 | HR 87 | Temp 98.2°F | Wt 155.6 lb

## 2015-01-13 DIAGNOSIS — R87613 High grade squamous intraepithelial lesion on cytologic smear of cervix (HGSIL): Secondary | ICD-10-CM

## 2015-01-13 MED ORDER — MEPERIDINE HCL 50 MG PO TABS
50.0000 mg | ORAL_TABLET | ORAL | Status: DC | PRN
Start: 2015-01-13 — End: 2015-07-14

## 2015-01-13 MED ORDER — TRAMADOL HCL 50 MG PO TABS: 50.0000 mg | ORAL_TABLET | Freq: Four times a day (QID) | ORAL | Status: DC | PRN

## 2015-01-13 NOTE — Progress Notes (Signed)
Gynecologic Oncology Interval Note  Chief Complaint: HSIL for postop evaluation.  Subjective:  She underwent diagnostic laparoscopy with conversion to TAH/BSO due to adhesions on 12/23/14 for HSIL PAP and abnormal bleeding. Surgery had been postponed a month earlier due to UTI with elevated white count.  On laparoscopy there were extensive adhesions of the omentum to the anterior abdominal wall and uterus and pelvic floor. It was elected to convert to laparotomy via her old low transverse incision. At laparotomy, the sigmoid colon was found to be densely adherent to the back of the uterus. A colotomy occurred there despite using careful sharp dissection. This was repaired in layers by Dr Marina Gravel from General Surgery.The right ovary appeared to have a chronic tubo-ovarian complex and the left side was not well visualized either. 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 The left ovary was not seen by pathology. There was no residual CIN in the cervix.  She had an uneventful post op course.   She is feeling well today, but does continue to have some soreness in the incision at night and would like refill of pain medicine.  She is using the estraderm patch for HRT and has no menopausal symptoms.  Oncology Treatment History: Elizabeth Zhang is a 49 y.o. female who is seen in consultation from Dr. Mick Sell and [place] for HGSIL pap smears.   Elizabeth Zhang has had many abnormal pap smears of the cervix over the years; she has been treated with both cryo therapy and a LEEP procedure, done about 10 years ago.   Her most recent pap history is as follows: 06/18/14 HSIL and ECC lGSIL 02/24/14 Colpo : Bx: LGSIL, ECC: HGSIL 01/23/14 ASCUS   In terms of her other medical history, Elizabeth Zhang has a history of Crohn's disease, though states she is currently in remission. She had been on prednisone for nearly 12 years, and in 1998, required an exploratory laparotomy, large bowel resection and  appendectomy with temporary colostomy, followed by a colostomy take down six weeks later. Most recently she had a small RV fistula, though has not yet pursued surgical management.  She does have some diarrhea.  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  We did a D&C 10/07/14.  Cone biopsy was also planned, but was not technically feasible due to long vagina and flush cervix.  DIAGNOSIS:  A. ENDOMETRIUM; CURETTINGS:  - CHRONIC ENDOMETRITIS.  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  - FRAGMENTS OF ENDOCERVICAL GLANDULAR TISSUE WITH SQUAMOUS METAPLASIA.   B. 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   Past Gynecologic History:  Menarche: 17 Menstrual details: Lasts up to 21 days days, heavy flow Menses regular: no Last Menstrual Period: 07/26/14 History of OCP/HRT use: No History of Abnormal pap: yes, HSIL, 3/16 Last pap: 3/16 History of STDs: The patient denies history of sexually transmitted disease. Contraception: none Sexually active: yes  Problem List: Patient Active Problem List   Diagnosis Date Noted  . S/P total hysterectomy and bilateral salpingo-oophorectomy 12/23/2014  . Colon injury 12/23/2014  . Abnormal uterine bleeding 09/16/2014  . 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 CD (Crohn's disease) 08/06/2014  . 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 HGSIL (high grade squamous intraepithelial lesion) on Pap smear of cervix 08/06/2014  . 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 Crohn disease     1980  . Duplicated ureter, left   . Cesarean delivery delivered [place]    x2    Past Surgical History: Past Surgical History  Procedure Laterality Date  . Appendectomy    . Cholecystectomy    . Cesarean section [place]  . Colon resection N/A 1998  . Hysteroscopy w/d&c N/A 10/07/2014    Procedure: DILATATION AND CURETTAGE /HYSTEROSCOPY;  Surgeon: Mellody Drown, MD;  Location: [facility];  Service: Gynecology;  Laterality: N/A;  . Ureter surgery Left 1979  . Laparoscopic assisted vaginal hysterectomy N/A 12/23/2014    Procedure: LAPAROSCOPIC ASSISTED VAGINAL HYSTERECTOMY, attempted;  Surgeon: Mellody Drown, MD;  Location: [facility];  Service: Gynecology;  Laterality: N/A;  . Abdominal hysterectomy N/A 12/23/2014    Procedure: HYSTERECTOMY ABDOMINAL REPAIR OF COLOTOMY;  Surgeon: Mellody Drown, MD;  Location: [facility];  Service: Gynecology;  Laterality: N/A;  . Cystoscopy  12/23/2014    Procedure: CYSTOSCOPY;  Surgeon: Mellody Drown, MD;  Location: [facility];  Service: Gynecology;;    Family History: Family History  Problem Relation Age of Onset  . Heart attack Father     Had Triple Bypass  . Lung disease Mother     Double lung transplant 2011    Social History: Social History   Social History  . Marital Status: Divorced    Spouse Name: N/A  . Number of Children: N/A  . Years of Education: N/A   Occupational History  . Not on file.   Social History Main Topics  . Smoking status: Current Every Day Smoker -- 0.25 packs/day for 30 years    Types: Cigarettes  . Smokeless tobacco: Never Used  . Alcohol Use: No  . Drug Use: No  . Sexual Activity: Not on file   Other Topics Concern  . Not on file   Social History Narrative    Allergies: Allergies  Allergen Reactions  . Codeine Nausea And Vomiting  . Percocet [Oxycodone-Acetaminophen] Nausea And Vomiting  . Vicodin [Hydrocodone-Acetaminophen] Nausea And Vomiting  . Morphine Hives and Nausea And Vomiting  . Nickel Rash  . Penicillin G Rash    Current Medications: Current Outpatient Prescriptions  Medication Sig Dispense Refill  . estradiol (VIVELLE-DOT) 0.05 MG/24HR patch Place 1 patch onto the skin 2 (two) times a week.    Marland Kitchen  acetaminophen (TYLENOL) 325 MG tablet Take 2 tablets (650 mg total) by mouth every 4 (four) hours.    . ALPRAZolam (XANAX) 1 MG tablet Take 1 mg by mouth at bedtime as needed for anxiety or sleep. prn    . cetirizine (ZYRTEC) 10 MG tablet Take 1 tablet by mouth every morning.     . enoxaparin (LOVENOX) 300 MG/3ML SOLN injection Inject 0.4 mLs (40 mg total) into the skin daily. 25 vial 0  . fluticasone (FLONASE) 50 MCG/ACT nasal spray Place 2 sprays into the nose every morning.     . meperidine (DEMEROL) 50 MG tablet Take 1 tablet (50 mg total) by mouth every 4 (four) hours as needed for moderate pain or severe pain. 45 tablet 0  . omeprazole (PRILOSEC) 40 MG capsule Take 40 mg by mouth daily as needed.    Marland Kitchen PROAIR HFA 108 (90 BASE) MCG/ACT inhaler Inhale 2 puffs into the lungs every 4 (four) hours as needed for wheezing or shortness of breath. Every 4 to 6 hours  prn  3  . traMADol (ULTRAM) 50 MG tablet Take 1 tablet (50 mg total) by mouth every 6 (six) hours as needed for moderate pain. 45 tablet 0  . tranexamic acid (LYSTEDA) 650 MG TABS tablet Take 2 tablets by mouth 3 (three) times daily as needed (take during menstral cycle).      No current facility-administered medications for this visit.     Review of Systems A comprehensive review of systems was negative.  Objective:  Physical Examination:  BP 114/78 mmHg  Pulse 87  Temp(Src) 98.2 F (36.8 C) (Oral)  Wt 155 lb 10.3 oz (70.6 kg)  ECOG Performance Status: 0 - Asymptomatic  General appearance: alert, cooperative and appears stated age 49: PERRL, EOMI, sclera clear Neck: no thyroid enlargement or cervical adenopathy Lymph node survey: non-palpable nodes Cardiovascular: regular rate and rhythm, without murmurs, rubs or gallops Respiratory: clear to auscultation Abdomen: no palpable masses, no hernias, nontender, nondistended,bowel sounds present,without hepatosplenomegaly.  Laparoscopic and low transverse incisions healing  well. Back: inspection of back is normal Extremities: no lower extremity edema Skin exam - normal coloration and turgor, no rashes, no suspicious skin lesions noted. Neurological exam reveals alert, oriented, normal speech, no focal findings or movement disorder noted. Pelvic: Vulva: normal.  Vagina: cuff healing well. Bimanual: no masses  Assessment:  Elizabeth Zhang is a 49 y.o. female with HSIL of cervix now S/P abdominal hysterectomy and BSO with normal post op exam.  She is using Estraderm patch, although all or part of the left ovary may still be in situ.   Plan:  Suggested return to clinic in 6 months for follow up PAP.  Although she may still have an ovary, this was not seen at the time of surgery.  So she will continue Estraderm patch.  Will renew pain medication for incisional pain.  All questions were answered to the patient's satisfaction.  Mellody Drown, MD  CC:  [address] Elizabeth Zhang, [place] [address] [phone]

## 2015-01-13 NOTE — Progress Notes (Signed)
Assisted physician with pelvic exam.

## 2015-07-14 ENCOUNTER — Inpatient Hospital Stay: Payer: BLUE CROSS/BLUE SHIELD | Attending: Obstetrics and Gynecology | Admitting: Obstetrics and Gynecology

## 2015-07-14 VITALS — BP 148/82 | HR 83 | Temp 97.1°F | Resp 18 | Wt 162.3 lb

## 2015-07-14 DIAGNOSIS — Z880 Allergy status to penicillin: Secondary | ICD-10-CM | POA: Insufficient documentation

## 2015-07-14 DIAGNOSIS — F17210 Nicotine dependence, cigarettes, uncomplicated: Secondary | ICD-10-CM | POA: Diagnosis not present

## 2015-07-14 DIAGNOSIS — N823 Fistula of vagina to large intestine: Secondary | ICD-10-CM | POA: Insufficient documentation

## 2015-07-14 DIAGNOSIS — K5090 Crohn's disease, unspecified, without complications: Secondary | ICD-10-CM | POA: Insufficient documentation

## 2015-07-14 DIAGNOSIS — Z79890 Hormone replacement therapy (postmenopausal): Secondary | ICD-10-CM | POA: Diagnosis not present

## 2015-07-14 DIAGNOSIS — R87613 High grade squamous intraepithelial lesion on cytologic smear of cervix (HGSIL): Secondary | ICD-10-CM | POA: Insufficient documentation

## 2015-07-14 DIAGNOSIS — Z90722 Acquired absence of ovaries, bilateral: Secondary | ICD-10-CM | POA: Insufficient documentation

## 2015-07-14 DIAGNOSIS — Q625 Duplication of ureter: Secondary | ICD-10-CM | POA: Diagnosis not present

## 2015-07-14 DIAGNOSIS — Z885 Allergy status to narcotic agent status: Secondary | ICD-10-CM | POA: Diagnosis not present

## 2015-07-14 DIAGNOSIS — Z9049 Acquired absence of other specified parts of digestive tract: Secondary | ICD-10-CM | POA: Diagnosis not present

## 2015-07-14 DIAGNOSIS — Z79899 Other long term (current) drug therapy: Secondary | ICD-10-CM | POA: Diagnosis not present

## 2015-07-14 DIAGNOSIS — Z90710 Acquired absence of both cervix and uterus: Secondary | ICD-10-CM | POA: Diagnosis not present

## 2015-07-14 DIAGNOSIS — Z933 Colostomy status: Secondary | ICD-10-CM | POA: Diagnosis not present

## 2015-07-14 NOTE — Progress Notes (Signed)
Gynecologic Oncology Interval Note  Chief Complaint: HSIL s/p hysterectomy for follow up PAP.  Subjective:  She underwent diagnostic laparoscopy with conversion to TAH/BSO due to adhesions on 12/23/14 for HSIL PAP and abnormal bleeding. Surgery had been postponed a month earlier due to UTI with elevated white count.  On laparoscopy there were extensive adhesions of the omentum to the anterior abdominal wall and uterus and pelvic floor. It was elected to convert to laparotomy via her old low transverse incision. At laparotomy, the sigmoid colon was found to be densely adherent to the back of the uterus. A colotomy occurred there despite using careful sharp dissection. This was repaired in layers by Dr Marina Gravel from General Surgery.The right ovary appeared to have a chronic tubo-ovarian complex and the left side was not well visualized either. 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 The left ovary was not seen by pathology. There was no residual CIN in the cervix.  She had an uneventful post op course.   She is feeling well today.  She is using the estraderm patch for HRT and has no menopausal symptoms.  Oncology Treatment History: Elizabeth Zhang is a 50 y.o. female who is seen in consultation from Dr. Mick Zhang and Elizabeth Zhang for HGSIL pap smears.   Elizabeth Zhang has had many abnormal pap smears of the cervix over the years; she has been treated with both cryo therapy and a LEEP procedure, done about 10 years ago.   Her most recent pap history is as follows: 06/18/14 HSIL and ECC lGSIL 02/24/14 Colpo : Bx: LGSIL, ECC: HGSIL 01/23/14 ASCUS   In terms of her other medical history, Elizabeth Zhang has a history of Crohn's disease, though states she is currently in remission. She had been on prednisone for nearly 12 years, and in 1998, required an exploratory laparotomy, large bowel resection and appendectomy with temporary colostomy, followed by a colostomy take down six weeks later. Most  recently she had a small RV fistula, though has not yet pursued surgical management.  She does have some diarrhea.  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  We did a D&C 10/07/14.  Cone biopsy was also planned, but was not technically feasible due to long vagina and flush cervix.  DIAGNOSIS:  A. ENDOMETRIUM; CURETTINGS:  - CHRONIC ENDOMETRITIS.  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  - FRAGMENTS OF ENDOCERVICAL GLANDULAR TISSUE WITH SQUAMOUS METAPLASIA.   B. 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   Past Gynecologic History:  Menarche: 17 Menstrual details: Lasts up to 21 days days, heavy flow Menses regular: no Last Menstrual Period: 07/26/14 History of OCP/HRT use: No History of Abnormal pap: yes, HSIL, 3/16 Last pap: 3/16 History of STDs: The patient denies history of sexually transmitted disease. Contraception: none Sexually active: yes  Problem List: Patient Active Problem List   Diagnosis Date Noted  . S/P total hysterectomy and bilateral salpingo-oophorectomy 12/23/2014  . Colon injury 12/23/2014  . Abnormal uterine bleeding 09/16/2014  . 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 CD (Crohn's disease) ([place]) 08/06/2014  . 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 HGSIL (high grade squamous intraepithelial lesion) on Pap smear of cervix 08/06/2014  . 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  Crohn disease     1980  . Duplicated ureter, left   . Cesarean delivery delivered Elizabeth Zhang    x2    Past Surgical History: Past Surgical History  Procedure Laterality Date  . Appendectomy    . Cholecystectomy    . Cesarean section [place]  . Colon resection N/A 1998  . Hysteroscopy w/d&c N/A 10/07/2014    Procedure:  DILATATION AND CURETTAGE /HYSTEROSCOPY;  Surgeon: Elizabeth Drown, MD;  Location: [facility];  Service: Gynecology;  Laterality: N/A;  . Ureter surgery Left 1979  . Laparoscopic assisted vaginal hysterectomy N/A 12/23/2014    Procedure: LAPAROSCOPIC ASSISTED VAGINAL HYSTERECTOMY, attempted;  Surgeon: Elizabeth Drown, MD;  Location: [facility];  Service: Gynecology;  Laterality: N/A;  . Abdominal hysterectomy N/A 12/23/2014    Procedure: HYSTERECTOMY ABDOMINAL REPAIR OF COLOTOMY;  Surgeon: Elizabeth Drown, MD;  Location: [facility];  Service: Gynecology;  Laterality: N/A;  . Cystoscopy  12/23/2014    Procedure: CYSTOSCOPY;  Surgeon: Elizabeth Drown, MD;  Location: [facility];  Service: Gynecology;;    Family History: Family History  Problem Relation Age of Onset  . Heart attack Father     Had Triple Bypass  . Lung disease Mother     Double lung transplant 2011    Social History: Social History   Social History  . Marital Status: Divorced    Spouse Name: N/A  . Number of Children: N/A  . Years of Education: N/A   Occupational History  . Not on file.   Social History Main Topics  . Smoking status: Current Every Day Smoker -- 0.25 packs/day for 30 years    Types: Cigarettes  . Smokeless tobacco: Never Used  . Alcohol Use: No  . Drug Use: No  . Sexual Activity: Not on file   Other Topics Concern  . Not on file   Social History Narrative    Allergies: Allergies  Allergen Reactions  . Codeine Nausea And Vomiting  . Percocet [Oxycodone-Acetaminophen] Nausea And Vomiting  . Vicodin [Hydrocodone-Acetaminophen] Nausea And Vomiting  . Morphine Hives and Nausea And Vomiting  . Nickel Rash  . Penicillin G Rash    Current Medications: Current Outpatient Prescriptions  Medication Sig Dispense Refill  . acetaminophen (TYLENOL) 325 MG tablet Take 2 tablets (650 mg total) by mouth every 4 (four) hours.    . ALPRAZolam (XANAX) 1 MG tablet Take 1 mg by mouth at bedtime as needed for anxiety  or sleep. prn    . cetirizine (ZYRTEC) 10 MG tablet Take 1 tablet by mouth every morning.     Marland Kitchen estradiol (VIVELLE-DOT) 0.05 MG/24HR patch Place 1 patch onto the skin 2 (two) times a week.    . fluticasone (FLONASE) 50 MCG/ACT nasal spray Place 2 sprays into the nose every morning.     Marland Kitchen omeprazole (PRILOSEC) 40 MG capsule Take 40 mg by mouth daily as needed.    Marland Kitchen PROAIR HFA 108 (90 BASE) MCG/ACT inhaler Inhale 2 puffs into the lungs every 4 (four) hours as needed for wheezing or shortness of breath. Every 4 to 6 hours prn  3   No current facility-administered medications for this visit.     Review of Systems A comprehensive review of systems was negative.  Objective:  Physical Examination:  BP 148/82 mmHg  Pulse 83  Temp(Src) 97.1 F (36.2 C) (Tympanic)  Resp 18  Wt 162 lb 4.1 oz (73.6 kg)  ECOG Performance Status: 0 - Asymptomatic  General appearance:  alert, cooperative and appears stated age 16: PERRL, EOMI, sclera clear Neck: no thyroid enlargement or cervical adenopathy Lymph node survey: non-palpable nodes Cardiovascular: regular rate and rhythm, without murmurs, rubs or gallops Respiratory: clear to auscultation Abdomen: no palpable masses, no hernias, nontender, nondistended,bowel sounds present,without hepatosplenomegaly.  Laparoscopic and low transverse incisions healing well. Back: inspection of back is normal Extremities: no lower extremity edema Skin exam - normal coloration and turgor, no rashes, no suspicious skin lesions noted. Neurological exam reveals alert, oriented, normal speech, no focal findings or movement disorder noted. Pelvic: Vulva: normal.  Vagina: normal, no lesions. Bimanual: no masses  Assessment:  SATRINA MAGALLANES is a 50 y.o. female with HSIL of cervix now S/P abdominal hysterectomy and BSO with normal post op exam.  She is using Estraderm patch, although all or part of the left ovary may still be in situ.   Plan:  Suggested return to  clinic in 1 year for follow up PAP.  Although she may still have an ovary, this was not seen at the time of surgery.  So she will continue Estraderm patch.  All questions were answered to the patient's satisfaction.  Elizabeth Drown, MD  CC:  [address] Elizabeth Zhang [place], [place] [address] [phone]

## 2015-07-14 NOTE — Progress Notes (Signed)
  Oncology Nurse Navigator Documentation  Navigator Location: [facility] (07/14/15 1100) Navigator Encounter Type: [place] Follow-up (07/14/15 1100)                                          Time Spent with Patient: 15 (07/14/15 1100)   Chaperoned pelvic and pap smear

## 2015-07-19 LAB — PAP LB AND HPV HIGH-RISK
HPV, HIGH-RISK: NEGATIVE
PAP SMEAR COMMENT: 0

## 2016-07-12 ENCOUNTER — Inpatient Hospital Stay: Payer: Self-pay | Attending: Obstetrics and Gynecology | Admitting: Obstetrics and Gynecology

## 2016-08-02 ENCOUNTER — Ambulatory Visit: Payer: Self-pay

## 2016-08-23 ENCOUNTER — Inpatient Hospital Stay: Payer: 59 | Attending: Obstetrics and Gynecology | Admitting: Obstetrics and Gynecology

## 2016-08-23 ENCOUNTER — Other Ambulatory Visit: Payer: Self-pay | Admitting: Obstetrics and Gynecology

## 2016-08-23 VITALS — BP 132/76 | HR 74 | Temp 98.2°F | Resp 18 | Ht 61.0 in | Wt 182.8 lb

## 2016-08-23 DIAGNOSIS — N823 Fistula of vagina to large intestine: Secondary | ICD-10-CM

## 2016-08-23 DIAGNOSIS — Z885 Allergy status to narcotic agent status: Secondary | ICD-10-CM | POA: Diagnosis not present

## 2016-08-23 DIAGNOSIS — Z880 Allergy status to penicillin: Secondary | ICD-10-CM | POA: Insufficient documentation

## 2016-08-23 DIAGNOSIS — Z90710 Acquired absence of both cervix and uterus: Secondary | ICD-10-CM | POA: Insufficient documentation

## 2016-08-23 DIAGNOSIS — Z90722 Acquired absence of ovaries, bilateral: Secondary | ICD-10-CM | POA: Diagnosis not present

## 2016-08-23 DIAGNOSIS — Z79890 Hormone replacement therapy (postmenopausal): Secondary | ICD-10-CM

## 2016-08-23 DIAGNOSIS — F17210 Nicotine dependence, cigarettes, uncomplicated: Secondary | ICD-10-CM | POA: Insufficient documentation

## 2016-08-23 DIAGNOSIS — Z79899 Other long term (current) drug therapy: Secondary | ICD-10-CM | POA: Diagnosis not present

## 2016-08-23 DIAGNOSIS — R87613 High grade squamous intraepithelial lesion on cytologic smear of cervix (HGSIL): Secondary | ICD-10-CM | POA: Diagnosis present

## 2016-08-23 DIAGNOSIS — K5090 Crohn's disease, unspecified, without complications: Secondary | ICD-10-CM | POA: Insufficient documentation

## 2016-08-23 NOTE — Progress Notes (Signed)
Pt has no gyn concerns. She has picked up weight since her surgery and her activity and diet has not changed. She has seasonal allergies

## 2016-08-23 NOTE — Progress Notes (Signed)
  Oncology Nurse Navigator Documentation Chaperoned pelvic exam. Pap obtained and sent to lab.  Navigator Location: [facility] (08/23/16 1100)   )Navigator Encounter Type: Follow-up Appt (08/23/16 1100)                     Patient Visit Type: GynOnc (08/23/16 1100)                              Time Spent with Patient: 15 (08/23/16 1100)

## 2016-08-23 NOTE — Progress Notes (Signed)
Gynecologic Oncology Interval Note  Chief Complaint: HSIL s/p hysterectomy for follow up PAP.  Subjective:   Interval history She had open hysterectomy 9/16 for HSIL.  She is feeling well today.  She is using the estraderm patch for HRT and has no menopausal symptoms.  Returns for follow up and PAP last year 3/17 was ASCUS with negative HPV.  No abnormal bleeding or discharge and she is sexually active without issues.    Oncology Treatment History: Elizabeth Zhang is a 51 y.o. female who is seen in consultation from Dr. Mick Sell and [place] for HGSIL pap smears.   Elizabeth Zhang has had many abnormal pap smears of the cervix over the years; she has been treated with both cryo therapy and a LEEP procedure, done about 10 years ago.   Her most recent pap history is as follows: 06/18/14 HSIL and ECC lGSIL 02/24/14 Colpo : Bx: LGSIL, ECC: HGSIL 01/23/14 ASCUS   In terms of her other medical history, Elizabeth Zhang has a history of Crohn's disease, though states she is currently in remission. She had been on prednisone for nearly 12 years, and in 1998, required an exploratory laparotomy, large bowel resection and appendectomy with temporary colostomy, followed by a colostomy take down six weeks later. Most recently she had a small RV fistula, though has not yet pursued surgical management.  She does have some diarrhea.  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  We did a D&C 10/07/14.  Cone biopsy was also planned, but was not technically feasible due to long vagina and flush cervix.  DIAGNOSIS:  A. ENDOMETRIUM; CURETTINGS:  - CHRONIC ENDOMETRITIS.  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  - FRAGMENTS OF ENDOCERVICAL GLANDULAR TISSUE WITH SQUAMOUS METAPLASIA.   B. 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   She underwent diagnostic laparoscopy  with conversion to TAH/BSO due to adhesions on 12/23/14 for HSIL PAP and abnormal bleeding. Surgery had been postponed a month earlier due to UTI with elevated white count.  On laparoscopy there were extensive adhesions of the omentum to the anterior abdominal wall and uterus and pelvic floor. It was elected to convert to laparotomy via her old low transverse incision. At laparotomy, the sigmoid colon was found to be densely adherent to the back of the uterus. A colotomy occurred there despite using careful sharp dissection. This was repaired in layers by Dr Marina Gravel from General Surgery.The right ovary appeared to have a chronic tubo-ovarian complex and the left side was not well visualized either. 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 The left ovary was not seen by pathology. There was no residual CIN in the cervix.  She had an uneventful post op course.  Past Gynecologic History:  Menarche: 17 Menstrual details: Lasts up to 21 days days, heavy flow Menses regular: no Last Menstrual Period: 07/26/14 History of OCP/HRT use: No History of Abnormal pap: yes, HSIL, 3/16 Last pap: 3/16 History of STDs: The patient denies history of sexually transmitted disease. Contraception: none Sexually active: yes  Problem List: Patient Active Problem List   Diagnosis Date Noted  . S/P total hysterectomy and bilateral salpingo-oophorectomy 12/23/2014  . Colon injury 12/23/2014  . Abnormal uterine bleeding 09/16/2014  . 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 CD (Crohn's disease) (  [place]) 08/06/2014  . 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 HGSIL (high grade squamous intraepithelial lesion) on Pap smear of cervix 08/06/2014  . 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 Cesarean delivery delivered [place]   x2  . Crohn disease ([place])    1980  . Duplicated ureter, left   . HGSIL (high grade  squamous intraepithelial lesion) on Pap smear of cervix     Past Surgical History: Past Surgical History:  Procedure Laterality Date  . ABDOMINAL HYSTERECTOMY N/A 12/23/2014   Procedure: HYSTERECTOMY ABDOMINAL REPAIR OF COLOTOMY;  Surgeon: Mellody Drown, MD;  Location: [facility] ORS;  Service: Gynecology;  Laterality: N/A;  . APPENDECTOMY    . [place]  . CHOLECYSTECTOMY    . COLON RESECTION N/A 1998  . CYSTOSCOPY  12/23/2014   Procedure: CYSTOSCOPY;  Surgeon: Mellody Drown, MD;  Location: [facility] ORS;  Service: Gynecology;;  . HYSTEROSCOPY W/D&C N/A 10/07/2014   Procedure: DILATATION AND CURETTAGE /HYSTEROSCOPY;  Surgeon: Mellody Drown, MD;  Location: [facility] ORS;  Service: Gynecology;  Laterality: N/A;  . LAPAROSCOPIC ASSISTED VAGINAL HYSTERECTOMY N/A 12/23/2014   Procedure: LAPAROSCOPIC ASSISTED VAGINAL HYSTERECTOMY, attempted;  Surgeon: Mellody Drown, MD;  Location: [facility] ORS;  Service: Gynecology;  Laterality: N/A;  . URETER SURGERY Left 1979    Family History: Family History  Problem Relation Age of Onset  . Heart attack Father     Had Triple Bypass  . Lung disease Mother     Double lung transplant 2011    Social History: Social History   Social History  . Marital status: Divorced    Spouse name: N/A  . Number of children: N/A  . Years of education: N/A   Occupational History  . Not on file.   Social History Main Topics  . Smoking status: Current Every Day Smoker    Packs/day: 0.25    Years: 30.00    Types: Cigarettes  . Smokeless tobacco: Never Used  . Alcohol use No  . Drug use: No  . Sexual activity: Not on file   Other Topics Concern  . Not on file   Social History Narrative  . No narrative on file    Allergies: Allergies  Allergen Reactions  . Codeine Nausea And Vomiting  . Percocet [Oxycodone-Acetaminophen] Nausea And Vomiting  . Vicodin [Hydrocodone-Acetaminophen] Nausea And Vomiting  . Morphine Hives and Nausea And Vomiting   . Nickel Rash  . Penicillin G Rash    Current Medications: Current Outpatient Prescriptions  Medication Sig Dispense Refill  . acetaminophen (TYLENOL) 325 MG tablet Take 2 tablets (650 mg total) by mouth every 4 (four) hours.    . ALPRAZolam (XANAX) 1 MG tablet Take 1 mg by mouth at bedtime as needed for anxiety or sleep. prn    . cetirizine (ZYRTEC) 10 MG tablet Take 1 tablet by mouth every morning.     Marland Kitchen estradiol (VIVELLE-DOT) 0.05 MG/24HR patch Place 1 patch onto the skin once a week.     . fluticasone (FLONASE) 50 MCG/ACT nasal spray Place 2 sprays into the nose every morning.     Marland Kitchen omeprazole (PRILOSEC) 40 MG capsule Take 40 mg by mouth daily as needed.    Marland Kitchen PROAIR HFA 108 (90 BASE) MCG/ACT inhaler Inhale 2 puffs into the lungs every 4 (four) hours as needed for wheezing or shortness of breath. Every 4 to 6 hours prn  3   No current facility-administered  medications for this visit.      Review of Systems A comprehensive review of systems was negative.  Objective:  Physical Examination:  BP 132/76   Pulse 74   Temp 98.2 F (36.8 C) (Tympanic)   Resp 18   Ht 5' 1"  (1.549 m)   Wt 182 lb 12.8 oz (82.9 kg)   BMI 34.54 kg/m   ECOG Performance Status: 0 - Asymptomatic  General appearance: alert, cooperative and appears stated age HEENT: PERRL, EOMI, sclera clear Neck: no thyroid enlargement or cervical adenopathy Lymph node survey: non-palpable nodes Cardiovascular: regular rate and rhythm, without murmurs, rubs or gallops Respiratory: clear to auscultation Abdomen: no palpable masses, no hernias, nontender, nondistended,bowel sounds present,without hepatosplenomegaly.  Laparoscopic and low transverse incisions normal. Back: inspection of back is normal Extremities: no lower extremity edema Skin exam - normal coloration and turgor, no rashes, no suspicious skin lesions noted. Neurological exam reveals alert, oriented, normal speech, no focal findings or movement disorder  noted. Pelvic: Vulva: normal.  Vagina: normal, no lesions. Bimanual: no masses  Assessment:  KEAMBER MACFADDEN is a 51 y.o. female with HSIL of cervix now S/P abdominal hysterectomy and BSO with normal exam.  PAP 3/17 showed ASCUS/HPV negative.  She is using Estraderm patch, although all or part of the left ovary may still be in situ.   Plan:  PAP done today.  If ASCUS or normal, suggested return to clinic in 1 year for follow up PAP with Dr Leonides Schanz at [facility].  Although she may still have an ovary, this was not seen at the time of surgery.  So she will continue Estraderm patch.  All questions were answered to the patient's satisfaction.  Mellody Drown, MD  CC:  [address] [place],  [phone]

## 2016-08-28 LAB — PAP LB (LIQUID-BASED): PAP SMEAR COMMENT: 0

## 2016-08-30 ENCOUNTER — Other Ambulatory Visit: Payer: Self-pay

## 2016-08-31 ENCOUNTER — Telehealth: Payer: Self-pay

## 2016-08-31 NOTE — Telephone Encounter (Signed)
  Oncology Nurse Navigator Documentation VOicemail left with Elizabeth Zhang to return call to go over pap smear results and to notify of Diflucan ordered by Dr. Fransisca Connors. Navigator Location: [facility] (08/31/16 1600)   )Navigator Encounter Type: Telephone;Diagnostic Results (08/31/16 1600)                                                    Time Spent with Patient: 15 (08/31/16 1600)

## 2016-09-01 ENCOUNTER — Telehealth: Payer: Self-pay

## 2016-09-01 MED ORDER — FLUCONAZOLE 150 MG PO TABS: 150.0000 mg | ORAL_TABLET | Freq: Every day | ORAL | 0 refills | Status: DC

## 2016-09-01 MED ORDER — FLUCONAZOLE 100 MG PO TABS: 150.0000 mg | ORAL_TABLET | Freq: Every day | ORAL | 0 refills | Status: DC

## 2016-09-01 NOTE — Addendum Note (Signed)
Addended by: Betti Cruz on: 09/01/2016 09:42 AM   Modules accepted: Orders

## 2016-09-01 NOTE — Telephone Encounter (Signed)
  Oncology Nurse Navigator Documentation Notified of pap smear results. Diflucan 180m oral tab, one dose, sent to pharmacy per Dr. BFransisca Connors [place] 6 months with Dr. BFransisca Connors  EPITHELIAL CELL ABNORMALITY.  ATYPICAL SQUAMOUS CELLS OF UNDETERMINED SIGNIFICANCE.  FUNGAL ORGANISMS MORPHOLOGICALLY CONSISTENT WITH CANDIDA SPECIES ARE  PRESENT.    Navigator Location: [facility] (09/01/16 0900)   )Navigator Encounter Type: Telephone;Diagnostic Results (09/01/16 0900)                                                    Time Spent with Patient: 15 (09/01/16 0900)

## 2018-10-22 ENCOUNTER — Telehealth: Payer: Self-pay

## 2018-10-22 ENCOUNTER — Other Ambulatory Visit: Payer: Self-pay

## 2018-10-22 DIAGNOSIS — Z1211 Encounter for screening for malignant neoplasm of colon: Secondary | ICD-10-CM

## 2018-10-22 NOTE — Telephone Encounter (Signed)
Gastroenterology Pre-Procedure Review  Request Date: 11/06/18 Requesting Physician: Dr. Marius Ditch  PATIENT REVIEW QUESTIONS: The patient responded to the following health history questions as indicated:    1. Are you having any GI issues? Diagnosed with Crohns at the age of 71 2. Do you have a personal history of Polyps? no 3. Do you have a family history of Colon Cancer or Polyps? no 4. Diabetes Mellitus? no 5. Joint replacements in the past 12 months?no 6. Major health problems in the past 3 months?no 7. Any artificial heart valves, MVP, or defibrillator?no    MEDICATIONS & ALLERGIES:    Patient reports the following regarding taking any anticoagulation/antiplatelet therapy:   Plavix, Coumadin, Eliquis, Xarelto, Lovenox, Pradaxa, Brilinta, or Effient? no Aspirin? no  Patient confirms/reports the following medications:  Current Outpatient Medications  Medication Sig Dispense Refill  . acetaminophen (TYLENOL) 325 MG tablet Take 2 tablets (650 mg total) by mouth every 4 (four) hours.    . ALPRAZolam (XANAX) 1 MG tablet Take 1 mg by mouth at bedtime as needed for anxiety or sleep. prn    . cetirizine (ZYRTEC) 10 MG tablet Take 1 tablet by mouth every morning.     Marland Kitchen estradiol (VIVELLE-DOT) 0.05 MG/24HR patch Place 1 patch onto the skin once a week.     . fluconazole (DIFLUCAN) 150 MG tablet Take 1 tablet (150 mg total) by mouth daily. 1 tablet 0  . fluticasone (FLONASE) 50 MCG/ACT nasal spray Place 2 sprays into the nose every morning.     Marland Kitchen omeprazole (PRILOSEC) 40 MG capsule Take 40 mg by mouth daily as needed.    Marland Kitchen PROAIR HFA 108 (90 BASE) MCG/ACT inhaler Inhale 2 puffs into the lungs every 4 (four) hours as needed for wheezing or shortness of breath. Every 4 to 6 hours prn  3   No current facility-administered medications for this visit.     Patient confirms/reports the following allergies:  Allergies  Allergen Reactions  . Codeine Nausea And Vomiting  . Percocet  [Oxycodone-Acetaminophen] Nausea And Vomiting  . Vicodin [Hydrocodone-Acetaminophen] Nausea And Vomiting  . Morphine Hives and Nausea And Vomiting  . Nickel Rash  . Penicillin G Rash    No orders of the defined types were placed in this encounter.   AUTHORIZATION INFORMATION Primary Insurance: 1D#: Group #:  Secondary Insurance: 1D#: Group #:  SCHEDULE INFORMATION: Date: 11/06/18 Time: Location:[facility]

## 2018-10-30 ENCOUNTER — Encounter: Payer: Self-pay | Admitting: *Deleted

## 2018-10-30 ENCOUNTER — Other Ambulatory Visit: Payer: Self-pay

## 2018-10-31 ENCOUNTER — Encounter: Payer: Self-pay | Admitting: Anesthesiology

## 2018-11-01 ENCOUNTER — Other Ambulatory Visit: Payer: Self-pay

## 2018-11-01 ENCOUNTER — Other Ambulatory Visit
Admission: RE | Admit: 2018-11-01 | Discharge: 2018-11-01 | Disposition: A | Discharge status: Home / Self Care | Payer: 59 | Source: Ambulatory Visit | Attending: Gastroenterology | Admitting: Gastroenterology

## 2018-11-01 DIAGNOSIS — Z1159 Encounter for screening for other viral diseases: Secondary | ICD-10-CM | POA: Insufficient documentation

## 2018-11-01 LAB — SARS CORONAVIRUS 2 (TAT 6-24 HRS): SARS Coronavirus 2: NEGATIVE

## 2018-11-04 NOTE — Anesthesia Preprocedure Evaluation (Addendum)
Anesthesia Evaluation  Patient identified by MRN, date of birth, ID band Patient awake    Reviewed: Allergy & Precautions, NPO status , Patient's Chart, lab work & pertinent test results  History of Anesthesia Complications Negative for: history of anesthetic complications  Airway Mallampati: III   Neck ROM: Full    Dental  (+) Partial Upper   Pulmonary asthma (allergy-induced) , Current Smoker (3 cigarette per week),    Pulmonary exam normal breath sounds clear to auscultation       Cardiovascular Exercise Tolerance: Good negative cardio ROS Normal cardiovascular exam Rhythm:Regular Rate:Normal     Neuro/Psych negative neurological ROS     GI/Hepatic GERD  ,Crohn's disease   Endo/Other  negative endocrine ROS  Renal/GU negative Renal ROS     Musculoskeletal   Abdominal   Peds  Hematology negative hematology ROS (+)   Anesthesia Other Findings   Reproductive/Obstetrics                            Anesthesia Physical Anesthesia Plan  ASA: II  Anesthesia Plan: General   Post-op Pain Management:    Induction: Intravenous  PONV Risk Score and Plan: 2 and Propofol infusion and TIVA  Airway Management Planned: Natural Airway  Additional Equipment:   Intra-op Plan:   Post-operative Plan:   Informed Consent: I have reviewed the patients History and Physical, chart, labs and discussed the procedure including the risks, benefits and alternatives for the proposed anesthesia with the patient or authorized representative who has indicated his/her understanding and acceptance.       Plan Discussed with: CRNA  Anesthesia Plan Comments:        Anesthesia Quick Evaluation

## 2018-11-05 NOTE — Discharge Instructions (Signed)

## 2018-11-06 ENCOUNTER — Other Ambulatory Visit: Payer: Self-pay

## 2018-11-06 ENCOUNTER — Ambulatory Visit: Payer: 59 | Admitting: Anesthesiology

## 2018-11-06 ENCOUNTER — Encounter
Admission: RE | Disposition: A | Discharge status: Home / Self Care | Payer: Self-pay | Source: Home / Self Care | Attending: Gastroenterology

## 2018-11-06 ENCOUNTER — Ambulatory Visit
Admission: RE | Admit: 2018-11-06 | Discharge: 2018-11-06 | Disposition: A | Discharge status: Home / Self Care | Payer: 59 | Attending: Gastroenterology | Admitting: Gastroenterology

## 2018-11-06 DIAGNOSIS — Z980 Intestinal bypass and anastomosis status: Secondary | ICD-10-CM | POA: Diagnosis not present

## 2018-11-06 DIAGNOSIS — J45909 Unspecified asthma, uncomplicated: Secondary | ICD-10-CM | POA: Insufficient documentation

## 2018-11-06 DIAGNOSIS — K635 Polyp of colon: Secondary | ICD-10-CM

## 2018-11-06 DIAGNOSIS — D122 Benign neoplasm of ascending colon: Secondary | ICD-10-CM | POA: Diagnosis not present

## 2018-11-06 DIAGNOSIS — K644 Residual hemorrhoidal skin tags: Secondary | ICD-10-CM | POA: Insufficient documentation

## 2018-11-06 DIAGNOSIS — Z8719 Personal history of other diseases of the digestive system: Secondary | ICD-10-CM | POA: Diagnosis not present

## 2018-11-06 DIAGNOSIS — Z79899 Other long term (current) drug therapy: Secondary | ICD-10-CM | POA: Insufficient documentation

## 2018-11-06 DIAGNOSIS — Z1211 Encounter for screening for malignant neoplasm of colon: Secondary | ICD-10-CM | POA: Diagnosis present

## 2018-11-06 DIAGNOSIS — F17210 Nicotine dependence, cigarettes, uncomplicated: Secondary | ICD-10-CM | POA: Insufficient documentation

## 2018-11-06 DIAGNOSIS — K219 Gastro-esophageal reflux disease without esophagitis: Secondary | ICD-10-CM | POA: Diagnosis not present

## 2018-11-06 HISTORY — PX: COLONOSCOPY WITH PROPOFOL: SHX5780

## 2018-11-06 HISTORY — PX: POLYPECTOMY: SHX5525

## 2018-11-06 SURGERY — COLONOSCOPY WITH PROPOFOL
Anesthesia: General

## 2018-11-06 MED ORDER — STERILE WATER FOR IRRIGATION IR SOLN
Status: DC | PRN
  Administered 2018-11-06: 15 mL

## 2018-11-06 MED ORDER — PROPOFOL 10 MG/ML IV BOLUS
INTRAVENOUS | Status: DC | PRN
  Administered 2018-11-06: 20 mg via INTRAVENOUS
  Administered 2018-11-06: 30 mg via INTRAVENOUS
  Administered 2018-11-06: 20 mg via INTRAVENOUS
  Administered 2018-11-06: 40 mg via INTRAVENOUS
  Administered 2018-11-06: 30 mg via INTRAVENOUS
  Administered 2018-11-06: 20 mg via INTRAVENOUS
  Administered 2018-11-06 (×2): 30 mg via INTRAVENOUS
  Administered 2018-11-06: 40 mg via INTRAVENOUS
  Administered 2018-11-06 (×7): 20 mg via INTRAVENOUS
  Administered 2018-11-06: 80 mg via INTRAVENOUS
  Administered 2018-11-06 (×2): 20 mg via INTRAVENOUS
  Administered 2018-11-06: 30 mg via INTRAVENOUS
  Administered 2018-11-06 (×3): 20 mg via INTRAVENOUS

## 2018-11-06 MED ORDER — ACETAMINOPHEN 325 MG PO TABS: 650.0000 mg | ORAL_TABLET | Freq: Once | ORAL | Status: DC | PRN

## 2018-11-06 MED ORDER — ACETAMINOPHEN 160 MG/5ML PO SOLN: 325.0000 mg | ORAL | Status: DC | PRN

## 2018-11-06 MED ORDER — ONDANSETRON HCL 4 MG/2ML IJ SOLN: 4.0000 mg | Freq: Once | INTRAMUSCULAR | Status: DC | PRN

## 2018-11-06 MED ORDER — LIDOCAINE HCL (CARDIAC) PF 100 MG/5ML IV SOSY
PREFILLED_SYRINGE | INTRAVENOUS | Status: DC | PRN
  Administered 2018-11-06: 40 mg via INTRAVENOUS

## 2018-11-06 MED ORDER — LACTATED RINGERS IV SOLN
INTRAVENOUS | Status: DC
  Administered 2018-11-06: 09:00:00 via INTRAVENOUS

## 2018-11-06 MED ORDER — SODIUM CHLORIDE 0.9 % IV SOLN: INTRAVENOUS | Status: DC

## 2018-11-06 SURGICAL SUPPLY — 27 items
CANISTER SUCT 1200ML W/VALVE (MISCELLANEOUS) ×3 IMPLANT
CLIP HMST 235XBRD CATH ROT (MISCELLANEOUS) IMPLANT
CLIP RESOLUTION 360 11X235 (MISCELLANEOUS)
ELECT REM PT RETURN 9FT ADLT (ELECTROSURGICAL) ×3
ELECTRODE REM PT RTRN 9FT ADLT (ELECTROSURGICAL) IMPLANT
ELEVIEW  INJECTABLE COMP 10 (MISCELLANEOUS) ×2
FCP ESCP3.2XJMB 240X2.8X (MISCELLANEOUS) ×2
FORCEPS BIOP RAD 4 LRG CAP 4 (CUTTING FORCEPS) IMPLANT
FORCEPS BIOP RJ4 240 W/NDL (MISCELLANEOUS) ×4
FORCEPS ESCP3.2XJMB 240X2.8X (MISCELLANEOUS) IMPLANT
GOWN CVR UNV OPN BCK APRN NK (MISCELLANEOUS) ×2 IMPLANT
GOWN ISOL THUMB LOOP REG UNIV (MISCELLANEOUS) ×4
INJECTABLE ELEVIEW COMP 10 (MISCELLANEOUS) IMPLANT
INJECTOR VARIJECT VIN23 (MISCELLANEOUS) IMPLANT
KIT DEFENDO VALVE AND CONN (KITS) IMPLANT
KIT ENDO PROCEDURE OLY (KITS) ×3 IMPLANT
MARKER SPOT ENDO TATTOO 5ML (MISCELLANEOUS) IMPLANT
PROBE APC STR FIRE (PROBE) IMPLANT
RETRIEVER NET ROTH 2.5X230 LF (MISCELLANEOUS) IMPLANT
SNARE COLD EXACTO (MISCELLANEOUS) ×2 IMPLANT
SNARE SHORT THROW 13M SML OVAL (MISCELLANEOUS) IMPLANT
SNARE SHORT THROW 30M LRG OVAL (MISCELLANEOUS) ×2 IMPLANT
SNARE SNG USE RND 15MM (INSTRUMENTS) IMPLANT
SPOT EX ENDOSCOPIC TATTOO (MISCELLANEOUS)
TRAP ETRAP POLY (MISCELLANEOUS) ×2 IMPLANT
VARIJECT INJECTOR VIN23 (MISCELLANEOUS) ×3
WATER STERILE IRR 250ML POUR (IV SOLUTION) ×3 IMPLANT

## 2018-11-06 NOTE — Transfer of Care (Signed)
Immediate Anesthesia Transfer of Care Note  Patient: Elizabeth Zhang  Procedure(s) Performed: COLONOSCOPY WITH PROPOFOL (N/A ) POLYPECTOMY (N/A )  Patient Location: PACU  Anesthesia Type: General  Level of Consciousness: awake, alert  and patient cooperative  Airway and Oxygen Therapy: Patient Spontanous Breathing and Patient connected to supplemental oxygen  Post-op Assessment: Post-op Vital signs reviewed, Patient's Cardiovascular Status Stable, Respiratory Function Stable, Patent Airway and No signs of Nausea or vomiting  Post-op Vital Signs: Reviewed and stable  Complications: No apparent anesthesia complications

## 2018-11-06 NOTE — Op Note (Signed)
[facility] Gastroenterology Patient Name: Elizabeth Zhang Procedure Date: 11/06/2018 10:14 AM MRN: [id number] Account #: [id number] Date of Birth: October 21, 1965 Admit Type: Outpatient Age: 53 Room: [facility] OR ROOM 01 Gender: Female Note Status: Finalized Procedure:            Colonoscopy Indications:          Screening for colorectal malignant neoplasm, , Last                        colonoscopy: September 2000, Personal history of                        Crohn's disease, ilea-colonic resection in 1998 Providers:            Lin Landsman MD, MD Referring MD:         Scot Jun. Jenne Campus (Referring MD) Medicines:            Monitored Anesthesia Care Complications:        No immediate complications. Estimated blood loss: None. Procedure:            Pre-Anesthesia Assessment:                       - Prior to the procedure, a History and Physical was                        performed, and patient medications and allergies were                        reviewed. The patient is competent. The risks and                        benefits of the procedure and the sedation options and                        risks were discussed with the patient. All questions                        were answered and informed consent was obtained.                        Patient identification and proposed procedure were                        verified by the physician, the nurse, the                        anesthesiologist, the anesthetist and the technician in                        the pre-procedure area in the procedure room in the                        endoscopy suite. Mental Status Examination: alert and                        oriented. Airway Examination: normal oropharyngeal                        airway and neck mobility. Respiratory Examination:  clear to auscultation. CV Examination: normal.                        Prophylactic Antibiotics: The patient does not require                      prophylactic antibiotics. Prior Anticoagulants: The                        patient has taken no previous anticoagulant or                        antiplatelet agents. ASA Grade Assessment: II - A                        patient with mild systemic disease. After reviewing the                        risks and benefits, the patient was deemed in                        satisfactory condition to undergo the procedure. The                        anesthesia plan was to use monitored anesthesia care                        (MAC). Immediately prior to administration of                        medications, the patient was re-assessed for adequacy                        to receive sedatives. The heart rate, respiratory rate,                        oxygen saturations, blood pressure, adequacy of                        pulmonary ventilation, and response to care were                        monitored throughout the procedure. The physical status                        of the patient was re-assessed after the procedure.                       After obtaining informed consent, the colonoscope was                        passed under direct vision. Throughout the procedure,                        the patient's blood pressure, pulse, and oxygen                        saturations were monitored continuously. The was                        introduced through the  anus and advanced to the the                        ileocolonic anastomosis. The colonoscopy was                        technically difficult and complex due to significant                        looping and the patient's body habitus. Successful                        completion of the procedure was aided by applying                        abdominal pressure. The patient tolerated the procedure                        well. The quality of the bowel preparation was good. Findings:      Skin tags were found on perianal exam.      There was  evidence of a prior end-to-side ileo-colonic anastomosis at 70       cm proximal to the anus. This was characterized by healthy appearing       mucosa. The anastomosis could not be traversed.      Three sessile polyps/granulation tissue were found in the ascending       colon, near the surgical anastomosis. Unable to lift the largest flat       polyp due to underlying scar tissue The polyps were 5 to 7 mm in size.       These polyps were removed with a hot snare. Resection and retrieval were       complete.      There was evidence of a prior functional end-to-end colo-rectal       anastomosis at 15 cm proximal to the anus. This was patent and was       characterized by healthy appearing mucosa. The anastomosis was traversed.      The retroflexed view of the distal rectum and anal verge was normal and       showed no anal or rectal abnormalities.      There was no evidence of colovaginal fistula Impression:           - Perianal skin tags found on perianal exam.                       - End-to-side ileo-colonic anastomosis, characterized                        by healthy appearing mucosa.                       - Three 5 to 7 mm polyps in the ascending colon,                        removed with a hot snare. Resected and retrieved.                       - Patent functional end-to-end colo-rectal anastomosis,  characterized by healthy appearing mucosa.                       - The distal rectum and anal verge are normal on                        retroflexion view. Recommendation:       - Discharge patient to home (with escort).                       - Resume previous diet today.                       - Continue present medications.                       - Await pathology results.                       - Repeat colonoscopy in 5-10 years for surveillance                        based on pathology results.                       - Recommend MR or CT enterography to evaluate  small                        bowel                       - Return to my office in 4 weeks. Procedure Code(s):    --- Professional ---                       [phone], Colonoscopy, flexible; with removal of tumor(s),                        polyp(s), or other lesion(s) by snare technique Diagnosis Code(s):    --- Professional ---                       Z12.11, Encounter for screening for malignant neoplasm                        of colon                       Z98.0, Intestinal bypass and anastomosis status                       K63.5, Polyp of colon                       K64.4, Residual hemorrhoidal skin tags                       Z87.19, Personal history of other diseases of the                        digestive system CPT copyright 2019 American Medical Association. All rights reserved. The codes documented in this report are preliminary and upon coder review may  be revised to meet current compliance requirements. Dr. Ulyess Mort Mace Weinberg Raeanne Gathers MD,  MD 11/06/2018 11:16:06 AM This report has been signed electronically. Number of Addenda: 0 Note Initiated On: 11/06/2018 10:14 AM Scope Withdrawal Time: 0 hours 27 minutes 35 seconds  Total Procedure Duration: 0 hours 43 minutes 41 seconds  Estimated Blood Loss: Estimated blood loss: none. Estimated blood loss: none.      [facility]

## 2018-11-06 NOTE — Anesthesia Postprocedure Evaluation (Signed)
Anesthesia Post Note  Patient: Elizabeth Zhang  Procedure(s) Performed: COLONOSCOPY WITH PROPOFOL (N/A ) POLYPECTOMY (N/A )  Patient location during evaluation: PACU Anesthesia Type: General Level of consciousness: awake and alert, oriented and patient cooperative Pain management: pain level controlled Vital Signs Assessment: post-procedure vital signs reviewed and stable Respiratory status: spontaneous breathing, nonlabored ventilation and respiratory function stable Cardiovascular status: blood pressure returned to baseline and stable Postop Assessment: adequate PO intake Anesthetic complications: no    Darrin Nipper

## 2018-11-06 NOTE — Anesthesia Procedure Notes (Signed)
Performed by: Lemuel Boodram, CRNA Pre-anesthesia Checklist: Patient identified, Emergency Drugs available, Suction available, Timeout performed and Patient being monitored Patient Re-evaluated:Patient Re-evaluated prior to induction Oxygen Delivery Method: Nasal cannula Placement Confirmation: positive ETCO2       

## 2018-11-06 NOTE — H&P (Signed)
Cephas Darby, MD [address]  [place]  [place], [place] [address]  Main: [phone]  Fax: [phone] Pager: [phone]  Primary Care Physician:  Nathaneil Canary, PA-C Primary Gastroenterologist:  Dr. Cephas Darby  Pre-Procedure History & Physical: HPI:  Elizabeth Zhang is a 53 y.o. female is here for an colonoscopy.   Past Medical History:  Diagnosis Date  . Cesarean delivery delivered [place]   x2  . Crohn disease ([place])    1980  . Duplicated ureter, left   . HGSIL (high grade squamous intraepithelial lesion) on Pap smear of cervix     Past Surgical History:  Procedure Laterality Date  . ABDOMINAL HYSTERECTOMY N/A 12/23/2014   Procedure: HYSTERECTOMY ABDOMINAL REPAIR OF COLOTOMY;  Surgeon: Mellody Drown, MD;  Location: [facility];  Service: Gynecology;  Laterality: N/A;  . APPENDECTOMY    . [place]  . CHOLECYSTECTOMY    . COLON RESECTION N/A 1998  . CYSTOSCOPY  12/23/2014   Procedure: CYSTOSCOPY;  Surgeon: Mellody Drown, MD;  Location: [facility];  Service: Gynecology;;  . HYSTEROSCOPY W/D&C N/A 10/07/2014   Procedure: DILATATION AND CURETTAGE /HYSTEROSCOPY;  Surgeon: Mellody Drown, MD;  Location: [facility];  Service: Gynecology;  Laterality: N/A;  . LAPAROSCOPIC ASSISTED VAGINAL HYSTERECTOMY N/A 12/23/2014   Procedure: LAPAROSCOPIC ASSISTED VAGINAL HYSTERECTOMY, attempted;  Surgeon: Mellody Drown, MD;  Location: [facility];  Service: Gynecology;  Laterality: N/A;  . URETER SURGERY Left 1979    Prior to Admission medications   Medication Sig Start Date End Date Taking? Authorizing Provider  acetaminophen (TYLENOL) 325 MG tablet Take 2 tablets (650 mg total) by mouth every 4 (four) hours. 12/25/14  Yes Ward, Honor Loh, MD  ALPRAZolam Duanne Moron) 1 MG tablet Take 1 mg by mouth at bedtime as needed for anxiety or sleep. prn 06/17/14  Yes [provider]  Budesonide-Formoterol Fumarate (SYMBICORT IN) Inhale into the lungs as  needed.   Yes [provider]  cetirizine (ZYRTEC) 10 MG tablet Take 1 tablet by mouth every morning.    Yes [provider]  omeprazole (PRILOSEC) 40 MG capsule Take 40 mg by mouth daily as needed.   Yes [provider]  PROAIR HFA 108 (90 BASE) MCG/ACT inhaler Inhale 2 puffs into the lungs every 4 (four) hours as needed for wheezing or shortness of breath. Every 4 to 6 hours prn 09/28/14  Yes [provider]    Allergies as of 10/22/2018 - Review Complete 08/23/2016  Allergen Reaction Noted  . Codeine Nausea And Vomiting 12/10/2014  . Percocet [oxycodone-acetaminophen] Nausea And Vomiting 12/10/2014  . Vicodin [hydrocodone-acetaminophen] Nausea And Vomiting 12/10/2014  . Morphine Hives and Nausea And Vomiting 09/16/2014  . Nickel Rash 09/16/2014  . Penicillin g Rash 09/16/2014    Family History  Problem Relation Age of Onset  . Heart attack Father        Had Triple Bypass  . Lung disease Mother        Double lung transplant 2011    Social History   Socioeconomic History  . Marital status: Divorced    Spouse name: Not on file  . Number of children: Not on file  . Years of education: Not on file  . Highest education level: Not on file  Occupational History  . Not on file  Social Needs  . Financial resource strain: Not on file  . Food insecurity    Worry: Not on file  Inability: Not on file  . Transportation needs    Medical: Not on file    Non-medical: Not on file  Tobacco Use  . Smoking status: Current Some Day Smoker    Packs/day: 0.25    Years: 30.00    Pack years: 7.50    Types: Cigarettes  . Smokeless tobacco: Never Used  . Tobacco comment: (10/30/18) currently only smokes 2-3 cigs/week  Substance and Sexual Activity  . Alcohol use: No    Alcohol/week: 0.0 standard drinks  . Drug use: No  . Sexual activity: Not on file  Lifestyle  . Physical activity    Days per week: Not on file    Minutes per session: Not on file  .  Stress: Not on file  Relationships  . Social Herbalist on phone: Not on file    Gets together: Not on file    Attends religious service: Not on file    Active member of club or organization: Not on file    Attends meetings of clubs or organizations: Not on file    Relationship status: Not on file  . Intimate partner violence    Fear of current or ex partner: Not on file    Emotionally abused: Not on file    Physically abused: Not on file    Forced sexual activity: Not on file  Other Topics Concern  . Not on file  Social History Narrative  . Not on file    Review of Systems: See HPI, otherwise negative ROS  Physical Exam: BP 131/76   Pulse 87   Temp 97.9 F (36.6 C)   Resp 18   Ht 5' 1"  (1.549 m)   Wt 78 kg   LMP 12/08/2014   SpO2 97%   BMI 32.49 kg/m  General:   Alert,  pleasant and cooperative in NAD Head:  Normocephalic and atraumatic. Neck:  Supple; no masses or thyromegaly. Lungs:  Clear throughout to auscultation.    Heart:  Regular rate and rhythm. Abdomen:  Soft, nontender and nondistended. Normal bowel sounds, without guarding, and without rebound.   Neurologic:  Alert and  oriented x4;  grossly normal neurologically.  Impression/Plan: Elizabeth Zhang is here for an colonoscopy to be performed for colon cancer screening  Risks, benefits, limitations, and alternatives regarding  colonoscopy have been reviewed with the patient.  Questions have been answered.  All parties agreeable.   Romanda Sear, MD  11/06/2018, 9:25 AM

## 2018-11-07 ENCOUNTER — Encounter: Payer: Self-pay | Admitting: Gastroenterology

## 2018-11-21 ENCOUNTER — Other Ambulatory Visit: Payer: Self-pay

## 2018-11-21 DIAGNOSIS — Z8719 Personal history of other diseases of the digestive system: Secondary | ICD-10-CM

## 2018-11-26 ENCOUNTER — Telehealth: Payer: Self-pay | Admitting: Gastroenterology

## 2018-11-26 NOTE — Telephone Encounter (Signed)
Tati with [facility] called & wanted to let us know the patient needs precert for her abd/pelvic CT scan on 11-28-18 order by Dr Marius Ditch.

## 2018-11-28 ENCOUNTER — Ambulatory Visit: Payer: 59

## 2018-12-04 ENCOUNTER — Ambulatory Visit
Admission: RE | Admit: 2018-12-04 | Discharge: 2018-12-04 | Disposition: A | Discharge status: Home / Self Care | Payer: 59 | Source: Ambulatory Visit | Attending: Gastroenterology | Admitting: Gastroenterology

## 2018-12-04 DIAGNOSIS — Z8719 Personal history of other diseases of the digestive system: Secondary | ICD-10-CM | POA: Insufficient documentation

## 2018-12-04 HISTORY — DX: Unspecified asthma, uncomplicated: J45.909

## 2018-12-04 LAB — POCT I-STAT CREATININE: Creatinine, Ser: 1.1 mg/dL — ABNORMAL HIGH (ref 0.44–1.00)

## 2018-12-04 IMAGING — CT CT ABDOMEN AND PELVIS WITH CONTRAST (ENTEROGRAPHY)
2 of 6 series · 15 of 46 positions shown, 17 images · IV contrast (APPLIED)
Comparison: Report from [DATE] CT scan

CLINICAL DATA: Crohn's disease. Unsuccessful colonoscopy. Prior
colon resection.

EXAM:
CT ABDOMEN AND PELVIS WITH CONTRAST (ENTEROGRAPHY)
TECHNIQUE: Multidetector CT of the abdomen and pelvis during bolus
administration of intravenous contrast. Negative oral contrast was
given.
CONTRAST:  100mL OMNIPAQUE IOHEXOL 300 MG/ML  SOLN

[Series 3: entero thins · axial · 0.75mm/px · z∈[-917,-543]mm · 12 of 211 slices shown, 14 images]
[im 12/211  soft-tissue]
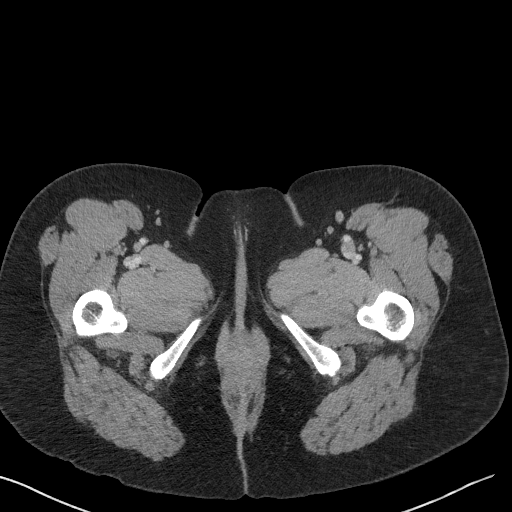
[im 12/211  bone]
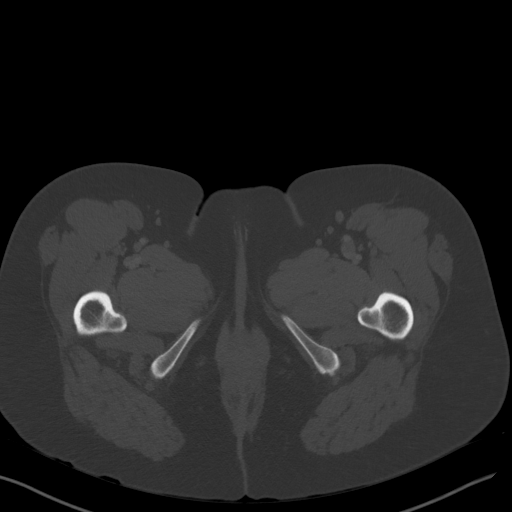
[im 36/211  soft-tissue]
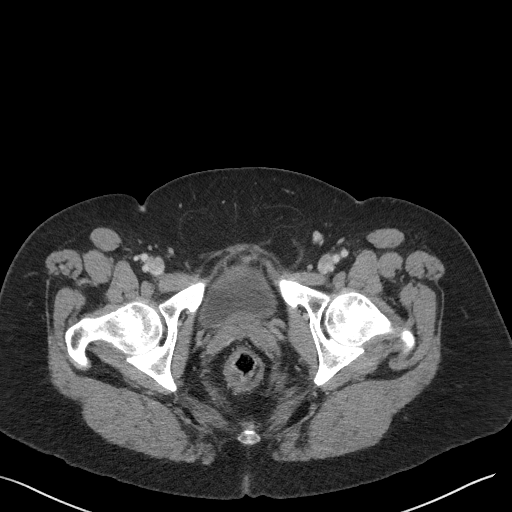
[im 47/211  soft-tissue]
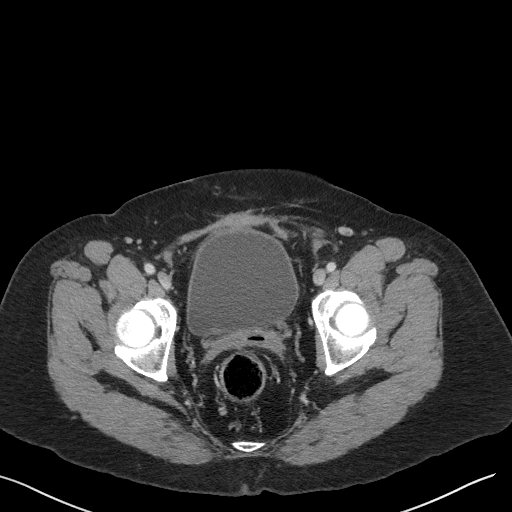
[im 59/211  soft-tissue]
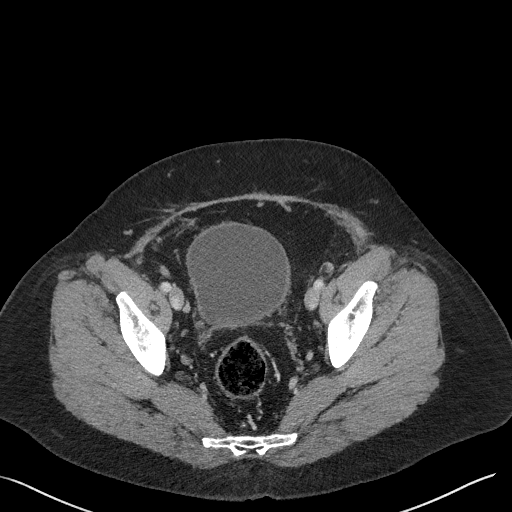
[im 82/211  soft-tissue]
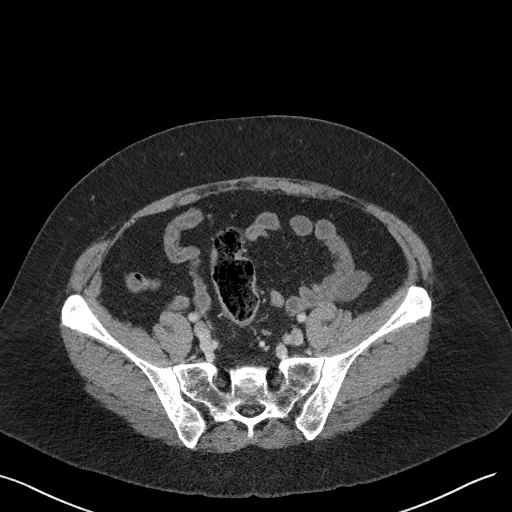
[im 94/211  soft-tissue]
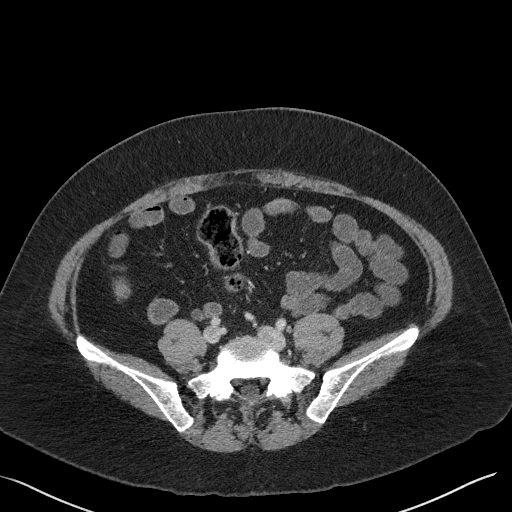
[im 117/211  soft-tissue]
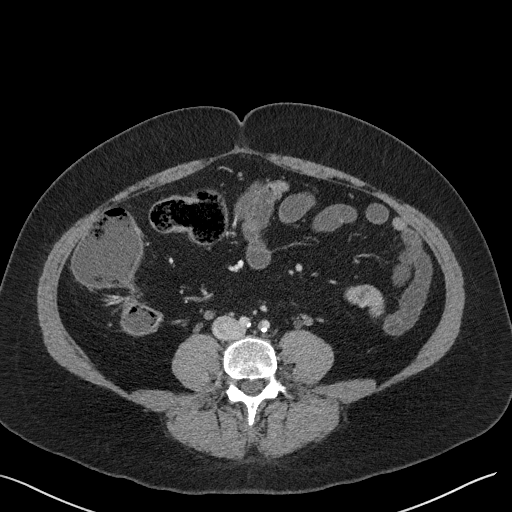
[im 129/211  soft-tissue]
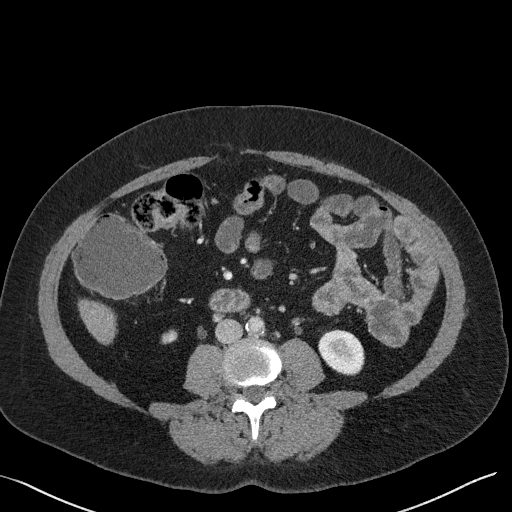
[im 152/211  soft-tissue]
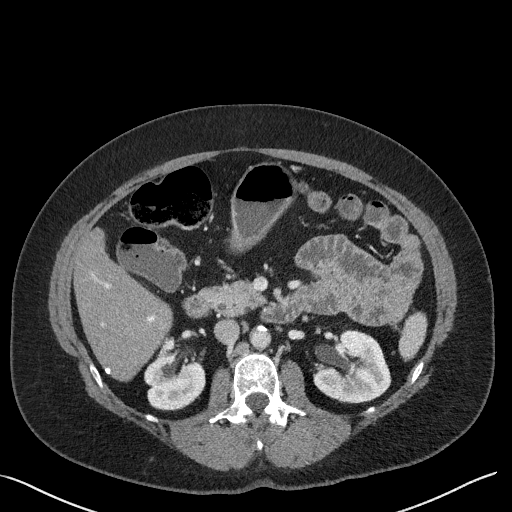
[im 152/211  bone]
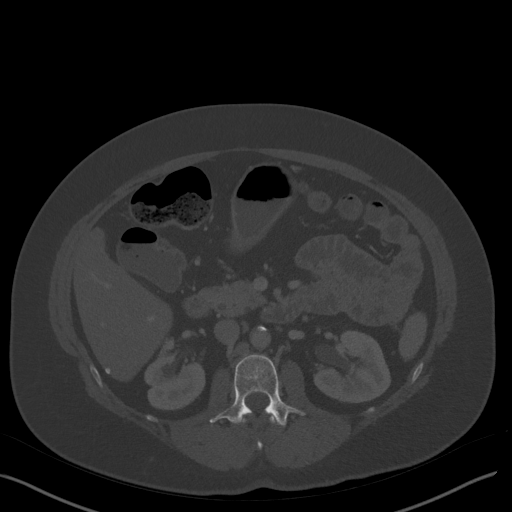
[im 164/211  soft-tissue]
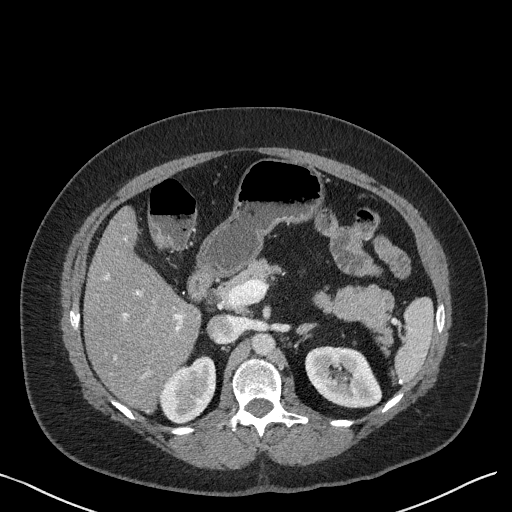
[im 176/211  soft-tissue]
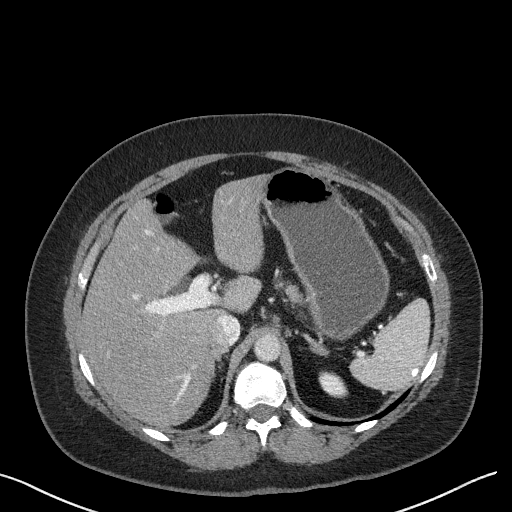
[im 199/211  soft-tissue]
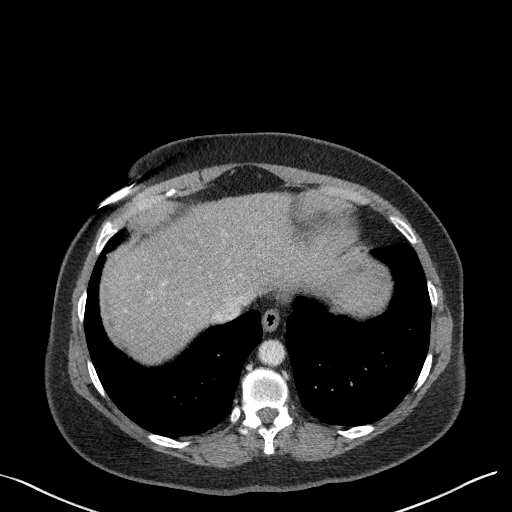

[Series 6: coronal · coronal · 0.81mm/px · 3 of 101 slices shown]
[im 34/101  soft-tissue]
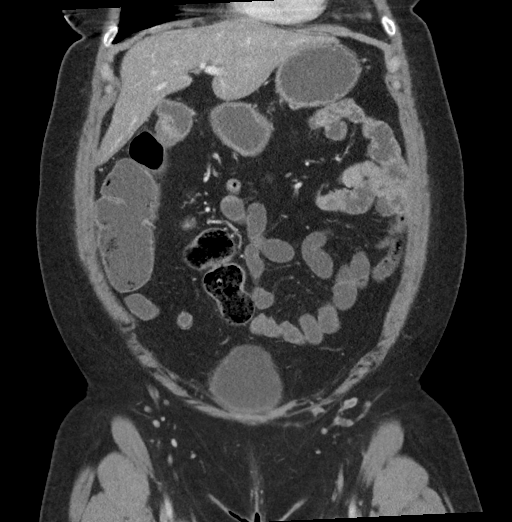
[im 45/101  soft-tissue]
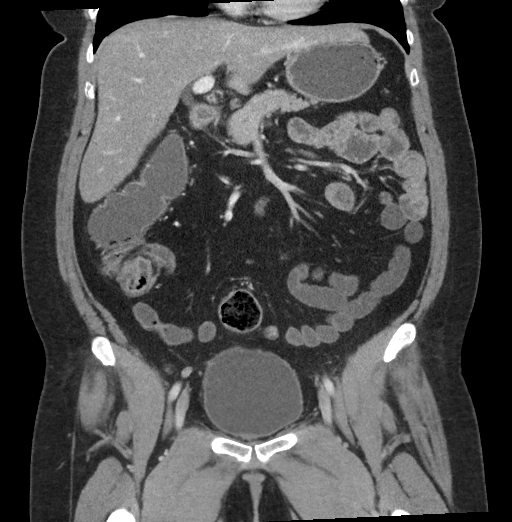
[im 56/101  soft-tissue]
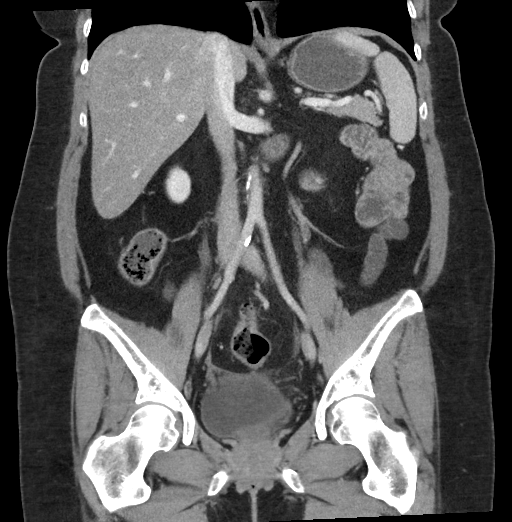

[15 of 46 positions shown; findings below may reference images not displayed]

FINDINGS: Lower chest:  Unremarkable

Hepatobiliary: Cholecystectomy. Rim calcified structures along the
posterior margin the right hepatic lobe could be spilled gallstones
but are nonspecific. There may be an adjacent loose clip in this
vicinity on image [DATE] as well.

Pancreas: Unremarkable

Spleen: Calcifications along the margin of the splenic capsule are
nonspecific. The somewhat resemble the calcifications along the
posterior margin of the right hepatic lobe.

Adrenals/Urinary Tract: Both adrenal glands appear normal. Scarring
of the right mid kidney. 4 mm right kidney lower pole nonobstructive
renal calculus. Duplicated right renal collecting system.

Stomach/Bowel: Unremarkable stomach and duodenum. Expected fold
pattern in the jejunum. Wall thickening and some narrowing in the
terminal ileum along the distal 5 cm. Mild adjacent wall thickening
in the proximal colon. Partial colectomy with anastomotic staple
line in rectosigmoid region.

2.1 cm segment of colonic narrowing at the hepatic flexure on image
53/3 most likely from peristaltic activity.

Vascular/Lymphatic: Aortoiliac atherosclerotic vascular disease.
Small retroperitoneal lymph nodes are not pathologically enlarged.

Reproductive: Uterus absent.  Adnexa unremarkable.

Other: 6 mm calcification in the left omentum, image 40/2.

Musculoskeletal: Lower rectus abdominus atrophy.
IMPRESSION: 1. Wall thickening in the distal 5 cm of the ileum and in the
proximal colon suggesting mild to moderate enterocolitis. This could
well be a manifestation of Crohn's disease. No abscess or
extraluminal gas.
2. Partial colectomy with anastomotic staple line in the upper
pelvis.
3. Otherwise normal appearance of the small bowel.
4. With regard to colon cancer screening, which was listed in part
of the patient's history, please note that today's scan gives an
over viw of the colon but CT enterography is not considered a highly
sensitive method of colon cancer screening in and of itself. The
colon is not intentionally distended which can hide lesions
including significant polyps. Also narrowings and colon such as in
the current case at the hepatic flexure which are probably due to
peristaltic activity are technically nonspecific. If CT workup for
colon cancer is indicated, the specialized CT colonography using CO2
insufflation might be appropriate.
5. There is some scattered calcifications along the posterior margin
of the liver and along the capsular margin of the spleen. These are
nonspecific and could be dystrophic but could also represent spilled
gallstones. There is a somewhat similar lesion along the left
omentum which is less likely to a spilled gallstone given this
location, and probably a small focus of fat necrosis.
6.  Aortic Atherosclerosis ([G2]-[G2]).
7. Duplicated right renal collecting system with right mid kidney
scarring.
8. Nonobstructive right nephrolithiasis.

## 2018-12-04 MED ORDER — IOHEXOL 300 MG/ML  SOLN
100.0000 mL | Freq: Once | INTRAMUSCULAR | Status: AC | PRN
  Administered 2018-12-04: 100 mL via INTRAVENOUS

## 2018-12-19 ENCOUNTER — Ambulatory Visit (INDEPENDENT_AMBULATORY_CARE_PROVIDER_SITE_OTHER): Payer: 59 | Admitting: Gastroenterology

## 2018-12-19 ENCOUNTER — Other Ambulatory Visit: Payer: Self-pay

## 2018-12-19 ENCOUNTER — Encounter: Payer: Self-pay | Admitting: Gastroenterology

## 2018-12-19 ENCOUNTER — Ambulatory Visit: Payer: 59 | Admitting: Gastroenterology

## 2018-12-19 VITALS — BP 131/98 | HR 130 | Temp 98.9°F | Resp 19 | Ht 61.0 in | Wt 179.4 lb

## 2018-12-19 DIAGNOSIS — K50012 Crohn's disease of small intestine with intestinal obstruction: Secondary | ICD-10-CM

## 2018-12-19 MED ORDER — BUDESONIDE 3 MG PO CPEP: 9.0000 mg | ORAL_CAPSULE | Freq: Every day | ORAL | 0 refills | Status: DC

## 2018-12-19 NOTE — Progress Notes (Signed)
Cephas Darby, MD [address]  [place]  [place], [place] [address]  Main: [phone]  Fax: [phone]    Gastroenterology Consultation  Referring Provider:     Arliss Journey* Primary Care Physician:  Elgie Collard, MD Primary Gastroenterologist:  Dr. Cephas Darby Reason for Consultation:     Small bowel Crohn's, postop recurrence        HPI:   Elizabeth Zhang is a 53 y.o. female referred by Dr. Elgie Collard, MD  for consultation & management of postop recurrence of small bowel Crohn's  Patient initially saw me for screening colonoscopy on 11/06/2018.  She was found to have ileocolonic anastomosis with anastomotic stricture and unable to traverse.  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  Subsequently, she underwent CT enterography which revealed thickening of the neoterminal ileum up to 5 cm in length.  Patient has history of Crohn's disease at age 16, has been maintained on steroids for about 12 years and mesalamine derivatives.  She underwent ileocolonic resection in 1998 secondary to small bowel obstruction.  Since then, patient has not been on any maintenance therapy for Crohn's.  However, over the last 3 months she has been experiencing central abdominal pain associated with abdominal bloating, several episodes of dark bowel movements.  She reports anywhere from 10 to 12/day.  She reports feeling tired.  She also reports large joint arthralgias.  She lost about 10 to 12 pounds in last 3 months.  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  Patient had 2 pregnancies several years ago which were uneventful without any exacerbation of Crohn's.  She also had rectosigmoid surgery when she had hysterectomy complicated by perforation. The latest labs from 2016 revealed normal hemoglobin, mild leukocytosis  Crohn's disease classification:  Age: < 17  Location: Ileal Behavior:  stricturing  Perianal:  no  IBD diagnosis: At age 47  Disease course:Patient has history of Crohn's disease at age 77, has been maintained on steroids for about 12 years and mesalamine derivatives.  She underwent ileocolonic resection in 1998 secondary to small bowel obstruction.  Since then, patient has not been on any maintenance therapy for Crohn's.  However, over the last 3 months she has been experiencing central abdominal pain associated with abdominal bloating, several episodes of dark bowel movements.  She reports anywhere from 10 to 12/day.  She reports feeling tired.  She also reports large joint arthralgias.  She lost about 10 to 12 pounds in last 3 months.  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  Patient had 2 pregnancies several years ago which were uneventful without any exacerbation of Crohn's.  She also had rectosigmoid surgery when she had hysterectomy complicated by perforation.  Extra intestinal manifestations: Large joint arthralgias  IBD surgical history: Ileocecal resection secondary to small bowel obstruction in 1998  Imaging:  MRE none CTE 12/04/2018 IMPRESSION: 1. Wall thickening in the distal 5 cm of the ileum and in the proximal colon suggesting mild to moderate enterocolitis. This could well be a manifestation of Crohn's disease. No abscess or extraluminal gas. 2. Partial colectomy with anastomotic staple line in the upper pelvis. 3. Otherwise normal appearance of the small bowel.  SBFT 05/03/2011 Findings raising concern of decreased peristalsis within  the distal and terminal ileum without further abnormalities.  Procedures:  Colonoscopy  11/06/2018 - Perianal skin tags found on perianal exam. - End-to-side ileo-colonic anastomosis, characterized  by healthy appearing mucosa, unable to traverse the anastomosis. - Three 5 to 7 mm polyps in the ascending colon, removed with a hot snare. Resected and retrieved. - Patent functional end-to-end  colo-rectal anastomosis, characterized by healthy appearing mucosa. - The distal rectum and anal verge are normal on retroflexion view.   Upper Endoscopy none  VCE none  IBD medications:  Steroids: Prednisone yes yes, budesonide none 5-ASA: Previously on mesalamine Immunomodulators: AZA, methotrexate : None TPMT status unknown Biologics: Nave Anti TNFs: Anti Integrins: Ustekinumab: Tofactinib: Clinical trial:   Past Medical History:  Diagnosis Date  . Asthma   . Cesarean delivery delivered [place]   x2  . Crohn disease ([place])    1980  . Duplicated ureter, left   . HGSIL (high grade squamous intraepithelial lesion) on Pap smear of cervix     Past Surgical History:  Procedure Laterality Date  . ABDOMINAL HYSTERECTOMY N/A 12/23/2014   Procedure: HYSTERECTOMY ABDOMINAL REPAIR OF COLOTOMY;  Surgeon: Mellody Drown, MD;  Location: [facility];  Service: Gynecology;  Laterality: N/A;  . APPENDECTOMY    . [place]  . CHOLECYSTECTOMY    . COLON RESECTION N/A 1998  . COLONOSCOPY WITH PROPOFOL N/A 11/06/2018   Procedure: COLONOSCOPY WITH PROPOFOL;  Surgeon: Lin Landsman, MD;  Location: [facility];  Service: Endoscopy;  Laterality: N/A;  . CYSTOSCOPY  12/23/2014   Procedure: CYSTOSCOPY;  Surgeon: Mellody Drown, MD;  Location: [facility];  Service: Gynecology;;  . HYSTEROSCOPY W/D&C N/A 10/07/2014   Procedure: DILATATION AND CURETTAGE /HYSTEROSCOPY;  Surgeon: Mellody Drown, MD;  Location: [facility];  Service: Gynecology;  Laterality: N/A;  . LAPAROSCOPIC ASSISTED VAGINAL HYSTERECTOMY N/A 12/23/2014   Procedure: LAPAROSCOPIC ASSISTED VAGINAL HYSTERECTOMY, attempted;  Surgeon: Mellody Drown, MD;  Location: [facility];  Service: Gynecology;  Laterality: N/A;  . POLYPECTOMY N/A 11/06/2018   Procedure: POLYPECTOMY;  Surgeon: Lin Landsman, MD;  Location: [facility];  Service: Endoscopy;  Laterality: N/A;  . URETER SURGERY Left 1979    Current Outpatient Medications:  .  acetaminophen (TYLENOL) 325 MG tablet, Take 2 tablets (650 mg total) by mouth every 4 (four) hours., Disp: , Rfl:  .  ALPRAZolam (XANAX) 1 MG tablet, Take 1 mg by mouth at bedtime as needed for anxiety or sleep. prn, Disp: , Rfl:  .  Budesonide-Formoterol Fumarate (SYMBICORT IN), Inhale into the lungs as needed., Disp: , Rfl:  .  cetirizine (ZYRTEC) 10 MG tablet, Take 1 tablet by mouth every morning. , Disp: , Rfl:  .  ferrous sulfate 325 (65 FE) MG tablet, Take by mouth., Disp: , Rfl:  .  omeprazole (PRILOSEC) 20 MG capsule, Take by mouth daily., Disp: , Rfl:  .  phentermine (ADIPEX-P) 37.5 MG tablet, Take 37.5 mg by mouth daily., Disp: , Rfl:  .  PROAIR HFA 108 (90 BASE) MCG/ACT inhaler, Inhale 2 puffs into the lungs every 4 (four) hours as needed for wheezing or shortness of breath. Every 4 to 6 hours prn, Disp: , Rfl: 3 .  tranexamic acid (LYSTEDA) 650 MG TABS tablet, Take by mouth., Disp: , Rfl:  .  Vitamin D, Ergocalciferol, (DRISDOL) 1.25 MG (50000 UT) CAPS capsule, Take 50,000 Units by mouth once a week., Disp: , Rfl:  .  budesonide (ENTOCORT EC) 3 MG 24 hr capsule, Take 3 capsules (9 mg total) by mouth daily., Disp: 90 capsule, Rfl: 0 .  fluticasone (FLONASE) 50 MCG/ACT nasal spray, Place into the nose.,  Disp: , Rfl:  .  omeprazole (PRILOSEC) 40 MG capsule, Take 40 mg by mouth daily as needed., Disp: , Rfl:    Family History  Problem Relation Age of Onset  . Heart attack Father        Had Triple Bypass  . Lung disease Mother        Double lung transplant 2011     Social History   Tobacco Use  . Smoking status: Current Some Day Smoker    Packs/day: 0.25    Years: 30.00    Pack years: 7.50    Types: Cigarettes  . Smokeless tobacco: Never Used  . Tobacco comment: (10/30/18) currently only smokes 2-3 cigs/week  Substance Use Topics  . Alcohol use: No    Alcohol/week: 0.0 standard drinks  . Drug use: No    Allergies as of 12/19/2018 -  Review Complete 12/19/2018  Allergen Reaction Noted  . Codeine Nausea And Vomiting 12/10/2014  . Percocet [oxycodone-acetaminophen] Nausea And Vomiting 12/10/2014  . Vicodin [hydrocodone-acetaminophen] Nausea And Vomiting 12/10/2014  . Morphine Hives and Nausea And Vomiting 09/16/2014  . Nickel Rash 09/16/2014  . Penicillin g Rash 09/16/2014    Review of Systems:    All systems reviewed and negative except where noted in HPI.   Physical Exam:  BP (!) 131/98 (BP Location: Left Arm, Patient Position: Sitting, Cuff Size: Large)   Pulse (!) 130   Temp 98.9 F (37.2 C)   Resp 19   Ht 5' 1"  (1.549 m)   Wt 179 lb 6.4 oz (81.4 kg)   LMP 12/08/2014   BMI 33.90 kg/m  Patient's last menstrual period was 12/08/2014.  General:   Alert,  Well-developed, well-nourished, pleasant and cooperative in NAD Head:  Normocephalic and atraumatic. Eyes:  Sclera clear, no icterus.   Conjunctiva pink. Ears:  Normal auditory acuity. Nose:  No deformity, discharge, or lesions. Mouth:  No deformity or lesions,oropharynx pink & moist. Neck:  Supple; no masses or thyromegaly. Lungs:  Respirations even and unlabored.  Clear throughout to auscultation.   No wheezes, crackles, or rhonchi. No acute distress. Heart:  Regular rate and rhythm; no murmurs, clicks, rubs, or gallops. Abdomen:  Normal bowel sounds. Soft, obese, non-tender and non-distended without masses, hepatosplenomegaly or hernias noted.  No guarding or rebound tenderness.   Rectal: Not performed Msk:  Symmetrical without gross deformities. Good, equal movement & strength bilaterally. Pulses:  Normal pulses noted. Extremities:  No clubbing or edema.  No cyanosis. Neurologic:  Alert and oriented x3;  grossly normal neurologically. Skin:  Intact without significant lesions or rashes. No jaundice. Psych:  Alert and cooperative. Normal mood and affect.  Imaging Studies: Reviewed  Assessment and Plan:   Elizabeth Zhang is a 53 y.o. female  with history of small bowel Crohn's status post ileocecal resection is seen in consultation for postop recurrence of small bowel Crohn's  Crohn's disease: Appears to be active and recurrent Crohn's based on the CT enterography and her clinical presentation Check CRP, fecal calprotectin levels Basic labs including CBC, CMP Discussed with her about biologic therapy including anti-TNF's, anti-Integrilin, anti-IL 13 agents, risks and benefits, side effects.  She preferred to try Humira.  Will apply to her insurance In the meantime, recommend budesonide 9 mg daily given severe diarrhea  IBD Health Maintenance  1.TB status: Unknown, check QuantiFERON gold 2. Anemia: Check CBC, ferritin, B12 and folate levels 3.Immunizations: Hep A and B, check serologies, Influenza recommend annual influenza vaccine, prevnar to be received,  pneumovax to be received, Varicella unknown, Zoster to be received 4.Cancer screening I) Colon cancer/dysplasia surveillance: Up-to-date II) Cervical cancer: Annual Pap smear up-to-date, normal III) Skin cancer - counseled about annual skin exam by dermatology and skin protection in summer using sun screen SPF > 50, clothing 5.Bone health Vitamin D status: low, currently on weekly vitamin D supplements Bone density testing: DEXA normal per patient 5. Labs: Ordered 6. Smoking: Never smoked 7. NSAIDs and Antibiotics use: None   Follow up in 4 weeks   Cephas Darby, MD

## 2018-12-23 LAB — COMPREHENSIVE METABOLIC PANEL
ALT: 20 IU/L (ref 0–32)
AST: 22 IU/L (ref 0–40)
Albumin/Globulin Ratio: 1.9 (ref 1.2–2.2)
Albumin: 4.5 g/dL (ref 3.8–4.9)
Alkaline Phosphatase: 107 IU/L (ref 39–117)
BUN/Creatinine Ratio: 13 (ref 9–23)
BUN: 13 mg/dL (ref 6–24)
Bilirubin Total: 0.4 mg/dL (ref 0.0–1.2)
CO2: 19 mmol/L — ABNORMAL LOW (ref 20–29)
Calcium: 9.7 mg/dL (ref 8.7–10.2)
Chloride: 104 mmol/L (ref 96–106)
Creatinine, Ser: 1.04 mg/dL — ABNORMAL HIGH (ref 0.57–1.00)
GFR calc Af Amer: 72 mL/min/{1.73_m2} (ref >59)
GFR calc non Af Amer: 62 mL/min/{1.73_m2} (ref >59)
Globulin, Total: 2.4 g/dL (ref 1.5–4.5)
Glucose: 84 mg/dL (ref 65–99)
Potassium: 4.5 mmol/L (ref 3.5–5.2)
Sodium: 140 mmol/L (ref 134–144)
Total Protein: 6.9 g/dL (ref 6.0–8.5)

## 2018-12-23 LAB — CBC
Hematocrit: 49.8 % — ABNORMAL HIGH (ref 34.0–46.6)
Hemoglobin: 17.2 g/dL — ABNORMAL HIGH (ref 11.1–15.9)
MCH: 31.6 pg (ref 26.6–33.0)
MCHC: 34.5 g/dL (ref 31.5–35.7)
MCV: 91 fL (ref 79–97)
Platelets: 233 10*3/uL (ref 150–450)
RBC: 5.45 x10E6/uL — ABNORMAL HIGH (ref 3.77–5.28)
RDW: 12.4 % (ref 11.7–15.4)
WBC: 10.5 10*3/uL (ref 3.4–10.8)

## 2018-12-23 LAB — FERRITIN: Ferritin: 179 ng/mL — ABNORMAL HIGH (ref 15–150)

## 2018-12-23 LAB — HEPATITIS A ANTIBODY, TOTAL: hep A Total Ab: NEGATIVE

## 2018-12-23 LAB — C-REACTIVE PROTEIN: CRP: 10 mg/L (ref 0–10)

## 2018-12-23 LAB — HEPATITIS B SURFACE ANTIGEN: Hepatitis B Surface Ag: NEGATIVE

## 2018-12-23 LAB — HEPATITIS C ANTIBODY: Hep C Virus Ab: 0.2 s/co ratio (ref 0.0–0.9)

## 2018-12-23 LAB — HEPATITIS B SURFACE ANTIBODY,QUALITATIVE: Hep B Surface Ab, Qual: NONREACTIVE

## 2018-12-23 LAB — B12 AND FOLATE PANEL
Folate: 5.6 ng/mL (ref >3.0)
Vitamin B-12: 476 pg/mL (ref 232–1245)

## 2018-12-23 LAB — HEPATITIS B CORE ANTIBODY, TOTAL: Hep B Core Total Ab: NEGATIVE

## 2018-12-23 LAB — IRON AND TIBC
Iron Saturation: 27 % (ref 15–55)
Iron: 95 ug/dL (ref 27–159)
Total Iron Binding Capacity: 348 ug/dL (ref 250–450)
UIBC: 253 ug/dL (ref 131–425)

## 2018-12-23 LAB — QUANTIFERON-TB GOLD PLUS

## 2019-01-11 ENCOUNTER — Other Ambulatory Visit: Payer: Self-pay | Admitting: Gastroenterology

## 2019-01-11 DIAGNOSIS — K50012 Crohn's disease of small intestine with intestinal obstruction: Secondary | ICD-10-CM

## 2019-01-13 NOTE — Telephone Encounter (Signed)
Last office visit 12/19/18 chron's disease  Last refill 12/19/2018 0 refills  Has appointment 01/21/2019

## 2019-01-15 ENCOUNTER — Telehealth: Payer: Self-pay

## 2019-01-15 NOTE — Telephone Encounter (Addendum)
Patient is calling wanting to make sure we had the correct insurance to get her Humira. Patient has united health care insurance ID number is [id number] Group number [id number] Pay Id number is [id number]. Her [employer] company is [facility] [employer] ID [id number] Group [id number] [place] [id number]. Called [name] to check to see if this has been started she states it has not been started. She stated she needed the Humira order demographic sheet, Last office visit note and labs.  Did PA on medication and the medication is approved.  Can you please write a order for Humira

## 2019-01-15 NOTE — Telephone Encounter (Signed)
Yes starter kit, which is citrate free 80mg sub cut followed by maintenance 40mg subcut every other week 

## 2019-01-15 NOTE — Telephone Encounter (Signed)
Sent Order sheet and all the requested information to [name]. Faxed the information. Did PA through cover my meds and medication is approved by patient insurance.

## 2019-01-15 NOTE — Telephone Encounter (Signed)
Elizabeth Zhang would know if she sent in prescription. Usually she keeps those in a folder in office. We can send humira order form again, Elizabeth Zhang knows how they look like. I can stop by office if needed  RV

## 2019-01-15 NOTE — Telephone Encounter (Signed)
I found the form. Do you want her to do the starter pack and then what do you want for her maintenance  does.

## 2019-01-21 ENCOUNTER — Telehealth: Payer: Self-pay | Admitting: Gastroenterology

## 2019-01-21 ENCOUNTER — Ambulatory Visit: Payer: 59 | Admitting: Gastroenterology

## 2019-01-21 NOTE — Telephone Encounter (Signed)
Pt is calling she states she has received a bill for visit 12/19/18 and she is being charged twice I gave her the billing number could you check into this as well per pt request

## 2019-01-22 ENCOUNTER — Ambulatory Visit (INDEPENDENT_AMBULATORY_CARE_PROVIDER_SITE_OTHER): Payer: 59 | Admitting: Gastroenterology

## 2019-01-22 ENCOUNTER — Other Ambulatory Visit: Payer: Self-pay

## 2019-01-22 ENCOUNTER — Encounter: Payer: Self-pay | Admitting: Gastroenterology

## 2019-01-22 ENCOUNTER — Telehealth: Payer: Self-pay

## 2019-01-22 VITALS — BP 148/90 | HR 103 | Temp 98.3°F | Wt 176.0 lb

## 2019-01-22 DIAGNOSIS — R197 Diarrhea, unspecified: Secondary | ICD-10-CM

## 2019-01-22 DIAGNOSIS — K50012 Crohn's disease of small intestine with intestinal obstruction: Secondary | ICD-10-CM

## 2019-01-22 DIAGNOSIS — Z23 Encounter for immunization: Secondary | ICD-10-CM

## 2019-01-22 DIAGNOSIS — D45 Polycythemia vera: Secondary | ICD-10-CM

## 2019-01-22 MED ORDER — SHINGRIX 50 MCG/0.5ML IM SUSR: 0.5000 mL | Freq: Once | INTRAMUSCULAR | 0 refills | Status: AC

## 2019-01-22 NOTE — Progress Notes (Signed)
Cephas Darby, MD [address]  [place]  [place], [place] [address]  Main: [phone]  Fax: [phone]    Gastroenterology Consultation  Referring Provider:     Elgie Collard, MD Primary Care Physician:  Elgie Collard, MD Primary Gastroenterologist:  Dr. Cephas Darby Reason for Consultation:     Small bowel Crohn's, postop recurrence        HPI:   Elizabeth Zhang is a 53 y.o. female referred by Dr. Elgie Collard, MD  for consultation & management of postop recurrence of small bowel Crohn's  Patient initially saw me for screening colonoscopy on 11/06/2018.  She was found to have ileocolonic anastomosis with anastomotic stricture and unable to traverse.  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  Subsequently, she underwent CT enterography which revealed thickening of the neoterminal ileum up to 5 cm in length.  Patient has history of Crohn's disease at age 3, has been maintained on steroids for about 12 years and mesalamine derivatives.  She underwent ileocolonic resection in 1998 secondary to small bowel obstruction.  Since then, patient has not been on any maintenance therapy for Crohn's.  However, over the last 3 months she has been experiencing central abdominal pain associated with abdominal bloating, several episodes of dark bowel movements.  She reports anywhere from 10 to 12/day.  She reports feeling tired.  She also reports large joint arthralgias.  She lost about 10 to 12 pounds in last 3 months.  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  Patient had 2 pregnancies several years ago which were uneventful without any exacerbation of Crohn's.  She also had rectosigmoid surgery when she had hysterectomy complicated by perforation. The latest labs from 2016 revealed normal hemoglobin, mild leukocytosis  Follow-up visit 01/22/2019 She continues to have ongoing diarrhea, frequency  improved from 15 bowel movements a day to 10 bowel movements a day.  Taking Entocort 9 mg daily which has modestly improved her symptoms.  Her weight is stable.  Patient is accompanied by her mom today. Humira just got approved yesterday.  Waiting for it to be delivered.  She did not undergo fecal calprotectin test yet  Crohn's disease classification:  Age: < 17  Location: Ileal Behavior: stricturing  Perianal:  no  IBD diagnosis: At age 58  Disease course:Patient has history of Crohn's disease at age 61, has been maintained on steroids for about 12 years and mesalamine derivatives.  She underwent ileocolonic resection in 1998 secondary to small bowel obstruction.  Since then, patient has not been on any maintenance therapy for Crohn's.  However, over the last 3 months she has been experiencing central abdominal pain associated with abdominal bloating, several episodes of dark bowel movements.  She reports anywhere from 10 to 12/day.  She reports feeling tired.  She also reports large joint arthralgias.  She lost about 10 to 12 pounds in last 3 months.  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  Patient had 2 pregnancies several years ago which were uneventful without any exacerbation of Crohn's.  She also had rectosigmoid surgery when she had hysterectomy complicated by perforation.  Extra intestinal manifestations: Large joint arthralgias  IBD surgical history: Ileocecal resection secondary to small bowel obstruction in 1998  Imaging:  MRE none CTE 12/04/2018 IMPRESSION: 1. Wall thickening in the distal 5 cm of the ileum and in the proximal colon suggesting mild to moderate enterocolitis. This could  well be a manifestation of Crohn's disease. No abscess or extraluminal gas. 2. Partial colectomy with anastomotic staple line in the upper pelvis. 3. Otherwise normal appearance of the small bowel.  SBFT 05/03/2011 Findings raising concern of decreased peristalsis within  the distal  and terminal ileum without further abnormalities.  Procedures:  Colonoscopy  11/06/2018 - Perianal skin tags found on perianal exam. - End-to-side ileo-colonic anastomosis, characterized by healthy appearing mucosa, unable to traverse the anastomosis. - Three 5 to 7 mm polyps in the ascending colon, removed with a hot snare. Resected and retrieved. - Patent functional end-to-end colo-rectal anastomosis, characterized by healthy appearing mucosa. - The distal rectum and anal verge are normal on retroflexion view.   Upper Endoscopy none  VCE none  IBD medications:  Steroids: Prednisone yes, budesonide yes 5-ASA: Previously on mesalamine Immunomodulators: AZA, methotrexate : None TPMT status unknown Biologics: Nave Anti TNFs: Anti Integrins: Ustekinumab: Tofactinib: Clinical trial:   Past Medical History:  Diagnosis Date  . Asthma   . Cesarean delivery delivered Elizabeth Zhang   x2  . Crohn disease ([place])    1980  . Duplicated ureter, left   . HGSIL (high grade squamous intraepithelial lesion) on Pap smear of cervix     Past Surgical History:  Procedure Laterality Date  . ABDOMINAL HYSTERECTOMY N/A 12/23/2014   Procedure: HYSTERECTOMY ABDOMINAL REPAIR OF COLOTOMY;  Surgeon: Mellody Drown, MD;  Location: [facility];  Service: Gynecology;  Laterality: N/A;  . APPENDECTOMY    . [place]  . CHOLECYSTECTOMY    . COLON RESECTION N/A 1998  . COLONOSCOPY WITH PROPOFOL N/A 11/06/2018   Procedure: COLONOSCOPY WITH PROPOFOL;  Surgeon: Lin Landsman, MD;  Location: [place];  Service: Endoscopy;  Laterality: N/A;  . CYSTOSCOPY  12/23/2014   Procedure: CYSTOSCOPY;  Surgeon: Mellody Drown, MD;  Location: [facility];  Service: Gynecology;;  . HYSTEROSCOPY W/D&C N/A 10/07/2014   Procedure: DILATATION AND CURETTAGE /HYSTEROSCOPY;  Surgeon: Mellody Drown, MD;  Location: [facility];  Service: Gynecology;  Laterality: N/A;  . LAPAROSCOPIC ASSISTED  VAGINAL HYSTERECTOMY N/A 12/23/2014   Procedure: LAPAROSCOPIC ASSISTED VAGINAL HYSTERECTOMY, attempted;  Surgeon: Mellody Drown, MD;  Location: [facility];  Service: Gynecology;  Laterality: N/A;  . POLYPECTOMY N/A 11/06/2018   Procedure: POLYPECTOMY;  Surgeon: Lin Landsman, MD;  Location: [place];  Service: Endoscopy;  Laterality: N/A;  . URETER SURGERY Left 1979   Current Outpatient Medications:  .  acetaminophen (TYLENOL) 325 MG tablet, Take 2 tablets (650 mg total) by mouth every 4 (four) hours., Disp: , Rfl:  .  ALPRAZolam (XANAX) 1 MG tablet, Take 1 mg by mouth at bedtime as needed for anxiety or sleep. prn, Disp: , Rfl:  .  budesonide (ENTOCORT EC) 3 MG 24 hr capsule, TAKE 3 CAPSULES (9 MG TOTAL) BY MOUTH DAILY., Disp: 90 capsule, Rfl: 0 .  Budesonide-Formoterol Fumarate (SYMBICORT IN), Inhale into the lungs as needed., Disp: , Rfl:  .  cetirizine (ZYRTEC) 10 MG tablet, Take 1 tablet by mouth every morning. , Disp: , Rfl:  .  omeprazole (PRILOSEC) 20 MG capsule, Take by mouth daily., Disp: , Rfl:  .  omeprazole (PRILOSEC) 40 MG capsule, Take 40 mg by mouth daily as needed., Disp: , Rfl:  .  phentermine (ADIPEX-P) 37.5 MG tablet, Take 37.5 mg by mouth daily., Disp: , Rfl:  .  PROAIR HFA 108 (90 BASE) MCG/ACT inhaler, Inhale 2 puffs into the lungs every 4 (four)  hours as needed for wheezing or shortness of breath. Every 4 to 6 hours prn, Disp: , Rfl: 3 .  Vitamin D, Ergocalciferol, (DRISDOL) 1.25 MG (50000 UT) CAPS capsule, Take 50,000 Units by mouth once a week., Disp: , Rfl:  .  Zoster Vaccine Adjuvanted [facility]) injection, Inject 0.5 mLs into the muscle once for 1 dose. The 2nd vaccine in 2 to 6 months after the first one, Disp: 0.5 mL, Rfl: 0   Family History  Problem Relation Age of Onset  . Heart attack Father        Had Triple Bypass  . Lung disease Mother        Double lung transplant 2011     Social History   Tobacco Use  . Smoking status: Former Smoker     Packs/day: 0.25    Years: 30.00    Pack years: 7.50    Types: Cigarettes  . Smokeless tobacco: Never Used  . Tobacco comment: (10/30/18) currently only smokes 2-3 cigs/week  Substance Use Topics  . Alcohol use: No    Alcohol/week: 0.0 standard drinks  . Drug use: No    Allergies as of 01/22/2019 - Review Complete 01/22/2019  Allergen Reaction Noted  . Codeine Nausea And Vomiting 12/10/2014  . Percocet [oxycodone-acetaminophen] Nausea And Vomiting 12/10/2014  . Vicodin [hydrocodone-acetaminophen] Nausea And Vomiting 12/10/2014  . Morphine Hives and Nausea And Vomiting 09/16/2014  . Nickel Rash 09/16/2014  . Penicillin g Rash 09/16/2014    Review of Systems:    All systems reviewed and negative except where noted in HPI.   Physical Exam:  BP (!) 148/90 (BP Location: Left Arm, Patient Position: Sitting, Cuff Size: Normal)   Pulse (!) 103   Temp 98.3 F (36.8 C) (Oral)   Wt 176 lb (79.8 kg)   LMP 12/08/2014   BMI 33.25 kg/m  Patient's last menstrual period was 12/08/2014.  General:   Alert,  Well-developed, well-nourished, pleasant and cooperative in NAD Head:  Normocephalic and atraumatic. Eyes:  Sclera clear, no icterus.   Conjunctiva pink. Ears:  Normal auditory acuity. Nose:  No deformity, discharge, or lesions. Mouth:  No deformity or lesions,oropharynx pink & moist. Neck:  Supple; no masses or thyromegaly. Lungs:  Respirations even and unlabored.  Clear throughout to auscultation.   No wheezes, crackles, or rhonchi. No acute distress. Heart:  Regular rate and rhythm; no murmurs, clicks, rubs, or gallops. Abdomen:  Normal bowel sounds. Soft, obese, non-tender and non-distended without masses, hepatosplenomegaly or hernias noted.  No guarding or rebound tenderness.   Rectal: Not performed Msk:  Symmetrical without gross deformities. Good, equal movement & strength bilaterally. Pulses:  Normal pulses noted. Extremities:  No clubbing or edema.  No  cyanosis. Neurologic:  Alert and oriented x3;  grossly normal neurologically. Skin:  Intact without significant lesions or rashes. No jaundice. Psych:  Alert and cooperative. Normal mood and affect.  Imaging Studies: Reviewed  Assessment and Plan:   TEQUITA MARRS is a 53 y.o. female with history of small bowel Crohn's status post ileocecal resection is seen for follow-up of postop recurrence of small bowel Crohn's  Crohn's disease: Appears to be active and recurrent Crohn's based on the CT enterography and her clinical presentation CRP is normal, check fecal calprotectin and GI panel to rule out infection Start Humira Continue budesonide 9 mg daily given severe diarrhea  IBD Health Maintenance  1.TB status: QuantiFERON gold negative 2. Anemia: She has polycythemia and elevated ferritin levels.  Family history of  polycythemia.  Referral to hematology 3.Immunizations: Twinrix vaccine, first dose 01/22/2019, Recommend annual influenza vaccine, prevnar on 01/22/2019, pneumovax in 3 to 6 months, Varicella unknown, Shingrix to be received at local pharmacy, prescription sent 4.Cancer screening I) Colon cancer/dysplasia surveillance: Up-to-date  II) Cervical cancer: Annual Pap smear up-to-date, normal III) Skin cancer - counseled about annual skin exam by dermatology and skin protection in summer using sun screen SPF > 50, clothing 5.Bone health Vitamin D status: low, currently on weekly vitamin D supplements Bone density testing: DEXA normal per patient 5. Labs: Ordered 6. Smoking: Never smoked 7. NSAIDs and Antibiotics use: None   Follow up in 2 months   Cephas Darby, MD

## 2019-01-22 NOTE — Telephone Encounter (Signed)
Did referral

## 2019-01-22 NOTE — Telephone Encounter (Signed)
-----   Message from Lin Landsman, MD sent at 01/22/2019 12:05 PM EDT ----- Regarding: Referral Referral to hematology, cancer center Dx: Polycythemia  Thanks RV

## 2019-01-27 ENCOUNTER — Other Ambulatory Visit: Payer: Self-pay

## 2019-01-28 ENCOUNTER — Inpatient Hospital Stay: Payer: 59

## 2019-01-28 ENCOUNTER — Other Ambulatory Visit: Payer: Self-pay

## 2019-01-28 ENCOUNTER — Encounter: Payer: Self-pay | Admitting: Oncology

## 2019-01-28 ENCOUNTER — Inpatient Hospital Stay: Payer: 59 | Attending: Oncology | Admitting: Oncology

## 2019-01-28 VITALS — BP 131/83 | HR 95 | Temp 97.7°F | Resp 18 | Wt 180.0 lb

## 2019-01-28 DIAGNOSIS — K5000 Crohn's disease of small intestine without complications: Secondary | ICD-10-CM

## 2019-01-28 DIAGNOSIS — Z7951 Long term (current) use of inhaled steroids: Secondary | ICD-10-CM

## 2019-01-28 DIAGNOSIS — Z9079 Acquired absence of other genital organ(s): Secondary | ICD-10-CM | POA: Diagnosis not present

## 2019-01-28 DIAGNOSIS — Z87891 Personal history of nicotine dependence: Secondary | ICD-10-CM | POA: Diagnosis not present

## 2019-01-28 DIAGNOSIS — D751 Secondary polycythemia: Secondary | ICD-10-CM

## 2019-01-28 DIAGNOSIS — R0683 Snoring: Secondary | ICD-10-CM

## 2019-01-28 DIAGNOSIS — Z90710 Acquired absence of both cervix and uterus: Secondary | ICD-10-CM | POA: Diagnosis not present

## 2019-01-28 DIAGNOSIS — Z79899 Other long term (current) drug therapy: Secondary | ICD-10-CM

## 2019-01-28 DIAGNOSIS — J45909 Unspecified asthma, uncomplicated: Secondary | ICD-10-CM | POA: Diagnosis not present

## 2019-01-28 DIAGNOSIS — Z90722 Acquired absence of ovaries, bilateral: Secondary | ICD-10-CM

## 2019-01-28 LAB — URINALYSIS, COMPLETE (UACMP) WITH MICROSCOPIC
Bilirubin Urine: NEGATIVE
Glucose, UA: NEGATIVE mg/dL
Hgb urine dipstick: NEGATIVE
Ketones, ur: NEGATIVE mg/dL
Nitrite: NEGATIVE
Protein, ur: NEGATIVE mg/dL
Specific Gravity, Urine: 1.009 (ref 1.005–1.030)
WBC, UA: >50 WBC/hpf — ABNORMAL HIGH (ref 0–5)
pH: 6 (ref 5.0–8.0)

## 2019-01-28 LAB — CBC WITH DIFFERENTIAL/PLATELET
Abs Immature Granulocytes: 0.02 10*3/uL (ref 0.00–0.07)
Basophils Absolute: 0.1 10*3/uL (ref 0.0–0.1)
Basophils Relative: 1 %
Eosinophils Absolute: 0.3 10*3/uL (ref 0.0–0.5)
Eosinophils Relative: 3 %
HCT: 44.9 % (ref 36.0–46.0)
Hemoglobin: 14.7 g/dL (ref 12.0–15.0)
Immature Granulocytes: 0 %
Lymphocytes Relative: 23 %
Lymphs Abs: 1.9 10*3/uL (ref 0.7–4.0)
MCH: 30.8 pg (ref 26.0–34.0)
MCHC: 32.7 g/dL (ref 30.0–36.0)
MCV: 94.1 fL (ref 80.0–100.0)
Monocytes Absolute: 0.6 10*3/uL (ref 0.1–1.0)
Monocytes Relative: 8 %
Neutro Abs: 5.3 10*3/uL (ref 1.7–7.7)
Neutrophils Relative %: 65 %
Platelets: 225 10*3/uL (ref 150–400)
RBC: 4.77 MIL/uL (ref 3.87–5.11)
RDW: 12.8 % (ref 11.5–15.5)
WBC: 8.2 10*3/uL (ref 4.0–10.5)
nRBC: 0 % (ref 0.0–0.2)

## 2019-01-28 LAB — COMPREHENSIVE METABOLIC PANEL
ALT: 19 U/L (ref 0–44)
AST: 17 U/L (ref 15–41)
Albumin: 3.7 g/dL (ref 3.5–5.0)
Alkaline Phosphatase: 79 U/L (ref 38–126)
Anion gap: 6 (ref 5–15)
BUN: 11 mg/dL (ref 6–20)
CO2: 24 mmol/L (ref 22–32)
Calcium: 9 mg/dL (ref 8.9–10.3)
Chloride: 110 mmol/L (ref 98–111)
Creatinine, Ser: 0.88 mg/dL (ref 0.44–1.00)
GFR calc Af Amer: >60 mL/min (ref >60)
GFR calc non Af Amer: >60 mL/min (ref >60)
Glucose, Bld: 124 mg/dL — ABNORMAL HIGH (ref 70–99)
Potassium: 3.5 mmol/L (ref 3.5–5.1)
Sodium: 140 mmol/L (ref 135–145)
Total Bilirubin: 0.3 mg/dL (ref 0.3–1.2)
Total Protein: 6.6 g/dL (ref 6.5–8.1)

## 2019-01-28 NOTE — Progress Notes (Signed)
Hematology/Oncology Consult note [facility] Telephone:([phone] [phone]  Patient Care Team: Elgie Collard, MD as PCP - General (Obstetrics and Gynecology) Clent Jacks, RN as Registered Nurse   Name of the patient: Elizabeth Zhang  [id number]  06-Jul-1965    Reason for referral- polycythemia   Referring physician- Dr. Marius Ditch  Date of visit: 01/28/19   History of presenting illness- Patient is a 53 year old female who was seen by Dr. Marius Ditch recently she has a history of small bowel Crohn's disease status post ileocecal resection.  There are some ongoing concerns for possible recurrence of her Crohn's.  She has been started on Humira and budesonide.  She has been referred to [place] for polycythemia.  Most recent CBC on 12/19/2018 showed white count of 10.5, H&H of 17.2/49.8 and a platelet count of 233.  Her prior CBC in 2016 showed a normal hemoglobin between 12-13.  I do not have any interim CBCs between 20 16-20 20.  Patient has a 30-pack-year smoking history but reports that she quit smoking about 2 years ago.  Also reports having difficulty to sleep at night and snores occasionally.  She does not wake up in the morning feeling refreshed after night sleep.  ECOG PS- 0  Pain scale- 0   Review of systems- Review of Systems  Constitutional: Positive for malaise/fatigue. Negative for chills, fever and weight loss.  HENT: Negative for congestion, ear discharge and nosebleeds.   Eyes: Negative for blurred vision.  Respiratory: Negative for cough, hemoptysis, sputum production, shortness of breath and wheezing.   Cardiovascular: Negative for chest pain, palpitations, orthopnea and claudication.  Gastrointestinal: Negative for abdominal pain, blood in stool, constipation, diarrhea, heartburn, melena, nausea and vomiting.  Genitourinary: Negative for dysuria, flank pain, frequency, hematuria and urgency.  Musculoskeletal: Negative for back pain,  joint pain and myalgias.  Skin: Negative for rash.  Neurological: Negative for dizziness, tingling, focal weakness, seizures, weakness and headaches.  Endo/Heme/Allergies: Does not bruise/bleed easily.  Psychiatric/Behavioral: Negative for depression and suicidal ideas. The patient does not have insomnia.     Allergies  Allergen Reactions  . Codeine Nausea And Vomiting  . Percocet [Oxycodone-Acetaminophen] Nausea And Vomiting  . Vicodin [Hydrocodone-Acetaminophen] Nausea And Vomiting  . Morphine Hives and Nausea And Vomiting  . Nickel Rash  . Penicillin G Rash    Patient Active Problem List   Diagnosis Date Noted  . S/P total hysterectomy and bilateral salpingo-oophorectomy 12/23/2014  . Abnormal uterine bleeding 09/16/2014  . 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 CD (Crohn's disease) ([place]) 08/06/2014  . 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 Asthma   . Cesarean delivery delivered [place]   x2  . Crohn disease ([place])    1980  . Duplicated ureter, left   . HGSIL (high grade squamous intraepithelial lesion) on Pap smear of cervix      Past Surgical History:  Procedure Laterality Date  . ABDOMINAL HYSTERECTOMY N/A 12/23/2014   Procedure: HYSTERECTOMY ABDOMINAL REPAIR OF COLOTOMY;  Surgeon: Mellody Drown, MD;  Location: [facility];  Service: Gynecology;  Laterality: N/A;  . APPENDECTOMY    . [place]  . CHOLECYSTECTOMY    . COLON RESECTION N/A 1998  . COLONOSCOPY WITH PROPOFOL N/A 11/06/2018   Procedure: COLONOSCOPY WITH PROPOFOL;  Surgeon: Lin Landsman, MD;  Location: [place];  Service: Endoscopy;  Laterality: N/A;  .  CYSTOSCOPY  12/23/2014   Procedure: CYSTOSCOPY;  Surgeon: Mellody Drown, MD;  Location: [facility];  Service: Gynecology;;  . HYSTEROSCOPY W/D&C N/A 10/07/2014   Procedure: DILATATION AND CURETTAGE /HYSTEROSCOPY;  Surgeon: Mellody Drown, MD;  Location: [facility];  Service: Gynecology;  Laterality: N/A;  . LAPAROSCOPIC ASSISTED VAGINAL HYSTERECTOMY N/A 12/23/2014   Procedure: LAPAROSCOPIC ASSISTED VAGINAL HYSTERECTOMY, attempted;  Surgeon: Mellody Drown, MD;  Location: [facility];  Service: Gynecology;  Laterality: N/A;  . POLYPECTOMY N/A 11/06/2018   Procedure: POLYPECTOMY;  Surgeon: Lin Landsman, MD;  Location: [place];  Service: Endoscopy;  Laterality: N/A;  . URETER SURGERY Left 1979    Social History   Socioeconomic History  . Marital status: Divorced    Spouse name: Not on file  . Number of children: Not on file  . Years of education: Not on file  . Highest education level: Not on file  Occupational History  . Not on file  Social Needs  . Financial resource strain: Not on file  . Food insecurity    Worry: Not on file    Inability: Not on file  . Transportation needs    Medical: Not on file    Non-medical: Not on file  Tobacco Use  . Smoking status: Former Smoker    Packs/day: 0.25    Years: 30.00    Pack years: 7.50    Types: Cigarettes  . Smokeless tobacco: Never Used  . Tobacco comment: (10/30/18) currently only smokes 2-3 cigs/week  Substance and Sexual Activity  . Alcohol use: No    Alcohol/week: 0.0 standard drinks  . Drug use: No  . Sexual activity: Not on file  Lifestyle  . Physical activity    Days per week: Not on file    Minutes per session: Not on file  . Stress: Not on file  Relationships  . Social Herbalist on phone: Not on file    Gets together: Not on file    Attends religious service: Not on file    Active member of club or organization: Not on file    Attends meetings of clubs or organizations: Not on file    Relationship status: Not on file  . Intimate partner violence    Fear of current or ex partner: Not on file    Emotionally abused: Not on file    Physically abused: Not on file    Forced sexual activity: Not on file  Other Topics  Concern  . Not on file  Social History Narrative  . Not on file     Family History  Problem Relation Age of Onset  . Heart attack Father        Had Triple Bypass  . Lung disease Mother        Double lung transplant 2011     Current Outpatient Medications:  .  acetaminophen (TYLENOL) 325 MG tablet, Take 2 tablets (650 mg total) by mouth every 4 (four) hours., Disp: , Rfl:  .  ALPRAZolam (XANAX) 1 MG tablet, Take 1 mg by mouth at bedtime as needed for anxiety or sleep. prn, Disp: , Rfl:  .  budesonide (ENTOCORT EC) 3 MG 24 hr capsule, TAKE 3 CAPSULES (9 MG TOTAL) BY MOUTH DAILY., Disp: 90 capsule, Rfl: 0 .  Budesonide-Formoterol Fumarate (SYMBICORT IN), Inhale into the lungs as needed., Disp: , Rfl:  .  cetirizine (ZYRTEC) 10 MG tablet, Take 1 tablet by mouth every morning. , Disp: , Rfl:  .  omeprazole (PRILOSEC) 20 MG capsule, Take by mouth daily., Disp: , Rfl:  .  omeprazole (PRILOSEC) 40 MG capsule, Take 40 mg by mouth daily as needed., Disp: , Rfl:  .  phentermine (ADIPEX-P) 37.5 MG tablet, Take 37.5 mg by mouth daily., Disp: , Rfl:  .  PROAIR HFA 108 (90 BASE) MCG/ACT inhaler, Inhale 2 puffs into the lungs every 4 (four) hours as needed for wheezing or shortness of breath. Every 4 to 6 hours prn, Disp: , Rfl: 3 .  Vitamin D, Ergocalciferol, (DRISDOL) 1.25 MG (50000 UT) CAPS capsule, Take 50,000 Units by mouth once a week., Disp: , Rfl:    Physical exam:  Vitals:   01/28/19 1353  BP: 131/83  Pulse: 95  Resp: 18  Temp: 97.7 F (36.5 C)  TempSrc: Tympanic  SpO2: 97%  Weight: 180 lb (81.6 kg)   Physical Exam HENT:     Head: Normocephalic and atraumatic.  Eyes:     Pupils: Pupils are equal, round, and reactive to light.  Neck:     Musculoskeletal: Normal range of motion.  Cardiovascular:     Rate and Rhythm: Normal rate and regular rhythm.     Heart sounds: Normal heart sounds.  Pulmonary:     Effort: Pulmonary effort is normal.     Breath sounds: Normal breath  sounds.  Abdominal:     General: Bowel sounds are normal.     Palpations: Abdomen is soft.     Comments: No palpable splenomegaly  Skin:    General: Skin is warm and dry.  Neurological:     Mental Status: She is alert and oriented to person, place, and time.        CMP Latest Ref Rng & Units 12/19/2018  Glucose 65 - 99 mg/dL 84  BUN 6 - 24 mg/dL 13  Creatinine 0.57 - 1.00 mg/dL 1.04(H)  Sodium 134 - 144 mmol/L 140  Potassium 3.5 - 5.2 mmol/L 4.5  Chloride 96 - 106 mmol/L 104  CO2 20 - 29 mmol/L 19(L)  Calcium 8.7 - 10.2 mg/dL 9.7  Total Protein 6.0 - 8.5 g/dL 6.9  Total Bilirubin 0.0 - 1.2 mg/dL 0.4  Alkaline Phos 39 - 117 IU/L 107  AST 0 - 40 IU/L 22  ALT 0 - 32 IU/L 20   CBC Latest Ref Rng & Units 12/19/2018  WBC 3.4 - 10.8 x10E3/uL 10.5  Hemoglobin 11.1 - 15.9 g/dL 17.2(H)  Hematocrit 34.0 - 46.6 % 49.8(H)  Platelets 150 - 450 x10E3/uL 233     Assessment and plan- Patient is a 53 y.o. female referred for polycythemia  Discussed that polycythemia can be primary or secondary.  Secondary polycythemia can be seen in patients who smoke, have chronic lung disease or those with obstructive sleep apnea.  Secondary polycythemia can also be seen in certain malignancies such as liver or kidney cancer.  Primary polycythemia or polycythemia vera is a myeloproliferative neoplasm that originates in the bone marrow.  Today I will obtain CBC with differential, CMP, urinalysis, EPO level, Jak 2 mutation testing and a baseline chest x-ray.  Her signs and symptoms are also concerning for possible obstructive sleep apnea and I would recommend that Dr. Georga Bora should evaluate her for possible sleep apnea  I will see the patient back for a video visit in 2 weeks time on a Monday afternoon   Thank you for this kind referral and the opportunity to participate in the care of this patient   Visit Diagnosis 1.  Polycythemia     Dr. Randa Evens, MD, MPH [facility] at [facility] [phone] 01/31/2019 7:36 AM

## 2019-01-28 NOTE — Progress Notes (Signed)
Referred for elevated RBC, pt reports has history of Crohn's disease.

## 2019-01-29 ENCOUNTER — Telehealth: Payer: Self-pay | Admitting: Gastroenterology

## 2019-01-29 LAB — ERYTHROPOIETIN: Erythropoietin: 8.4 m[IU]/mL (ref 2.6–18.5)

## 2019-01-29 NOTE — Telephone Encounter (Signed)
Spoke with pt and pharmacy and corrected prescription has been faxed and received yesterday 01/28/2019, pt has been made aware that pharmacy will contact her concerning delivery schedule, pt verbalized understanding

## 2019-01-29 NOTE — Telephone Encounter (Signed)
Pt left vm she states she was under the impression her specialty pharmacy would have a prescription for rx Humira dn she called them they stated they did not have  A prescription please call pt

## 2019-01-31 ENCOUNTER — Encounter: Payer: Self-pay | Admitting: Oncology

## 2019-01-31 LAB — JAK2 GENOTYPR

## 2019-02-03 ENCOUNTER — Other Ambulatory Visit: Payer: Self-pay

## 2019-02-03 ENCOUNTER — Encounter: Payer: Self-pay | Admitting: Oncology

## 2019-02-03 ENCOUNTER — Inpatient Hospital Stay (HOSPITAL_BASED_OUTPATIENT_CLINIC_OR_DEPARTMENT_OTHER): Payer: 59 | Admitting: Oncology

## 2019-02-03 DIAGNOSIS — D751 Secondary polycythemia: Secondary | ICD-10-CM | POA: Diagnosis not present

## 2019-02-03 NOTE — Progress Notes (Signed)
Patient stated that she had been doing well.

## 2019-02-04 NOTE — Progress Notes (Signed)
I connected with Elizabeth Zhang on 02/04/19 at  2:45 PM EDT by video enabled telemedicine visit and verified that I am speaking with the correct person using two identifiers.   I discussed the limitations, risks, security and privacy concerns of performing an evaluation and management service by telemedicine and the availability of in-person appointments. I also discussed with the patient that there may be a patient responsible charge related to this service. The patient expressed understanding and agreed to proceed.  Other persons participating in the visit and their role in the encounter:  none  Patient's location:  work Provider's location:  work  Diagnosis: Polycythemia likely transient due to hemoconcentration  Chief Complaint: Discuss results of blood work  History of present illness: Patient is a 53 year old female who was seen by Dr. Marius Ditch recently she has a history of small bowel Crohn's disease status post ileocecal resection.  There are some ongoing concerns for possible recurrence of her Crohn's.  She has been started on Humira and budesonide.  She has been referred to [place] for polycythemia.  Most recent CBC on 12/19/2018 showed white count of 10.5, H&H of 17.2/49.8 and a platelet count of 233.  Her prior CBC in 2016 showed a normal hemoglobin between 12-13.  I do not have any interim CBCs between 20 16-20 20.  Patient has a 30-pack-year smoking history but reports that she quit smoking about 2 years ago.  Also reports having difficulty to sleep at night and snores occasionally.  She does not wake up in the morning feeling refreshed after night sleep.   Interval history: No acute issues since the last visit.  She continues to have sleep issues but other than that she denies other complaints at this time   Review of Systems  Constitutional: Positive for malaise/fatigue. Negative for chills, fever and weight loss.  HENT: Negative for congestion, ear discharge and nosebleeds.   Eyes:  Negative for blurred vision.  Respiratory: Negative for cough, hemoptysis, sputum production, shortness of breath and wheezing.   Cardiovascular: Negative for chest pain, palpitations, orthopnea and claudication.  Gastrointestinal: Negative for abdominal pain, blood in stool, constipation, diarrhea, heartburn, melena, nausea and vomiting.  Genitourinary: Negative for dysuria, flank pain, frequency, hematuria and urgency.  Musculoskeletal: Negative for back pain, joint pain and myalgias.  Skin: Negative for rash.  Neurological: Negative for dizziness, tingling, focal weakness, seizures, weakness and headaches.  Endo/Heme/Allergies: Does not bruise/bleed easily.  Psychiatric/Behavioral: Negative for depression and suicidal ideas. The patient does not have insomnia.     Allergies  Allergen Reactions  . Codeine Nausea And Vomiting  . Percocet [Oxycodone-Acetaminophen] Nausea And Vomiting  . Vicodin [Hydrocodone-Acetaminophen] Nausea And Vomiting  . Morphine Hives and Nausea And Vomiting  . Nickel Rash  . Penicillin G Rash    Past Medical History:  Diagnosis Date  . Asthma   . Cesarean delivery delivered [place]   x2  . Crohn disease ([place])    1980  . Duplicated ureter, left   . HGSIL (high grade squamous intraepithelial lesion) on Pap smear of cervix     Past Surgical History:  Procedure Laterality Date  . ABDOMINAL HYSTERECTOMY N/A 12/23/2014   Procedure: HYSTERECTOMY ABDOMINAL REPAIR OF COLOTOMY;  Surgeon: Mellody Drown, MD;  Location: [facility];  Service: Gynecology;  Laterality: N/A;  . APPENDECTOMY    . [place]  . CHOLECYSTECTOMY    . COLON RESECTION N/A 1998  . COLONOSCOPY WITH PROPOFOL N/A 11/06/2018  Procedure: COLONOSCOPY WITH PROPOFOL;  Surgeon: Lin Landsman, MD;  Location: [place];  Service: Endoscopy;  Laterality: N/A;  . CYSTOSCOPY  12/23/2014   Procedure: CYSTOSCOPY;  Surgeon: Mellody Drown, MD;  Location: [facility];   Service: Gynecology;;  . HYSTEROSCOPY W/D&C N/A 10/07/2014   Procedure: DILATATION AND CURETTAGE /HYSTEROSCOPY;  Surgeon: Mellody Drown, MD;  Location: [facility];  Service: Gynecology;  Laterality: N/A;  . LAPAROSCOPIC ASSISTED VAGINAL HYSTERECTOMY N/A 12/23/2014   Procedure: LAPAROSCOPIC ASSISTED VAGINAL HYSTERECTOMY, attempted;  Surgeon: Mellody Drown, MD;  Location: [facility];  Service: Gynecology;  Laterality: N/A;  . POLYPECTOMY N/A 11/06/2018   Procedure: POLYPECTOMY;  Surgeon: Lin Landsman, MD;  Location: [place];  Service: Endoscopy;  Laterality: N/A;  . URETER SURGERY Left 1979    Social History   Socioeconomic History  . Marital status: Divorced    Spouse name: Not on file  . Number of children: Not on file  . Years of education: Not on file  . Highest education level: Not on file  Occupational History  . Not on file  Social Needs  . Financial resource strain: Not on file  . Food insecurity    Worry: Not on file    Inability: Not on file  . Transportation needs    Medical: Not on file    Non-medical: Not on file  Tobacco Use  . Smoking status: Former Smoker    Packs/day: 0.25    Years: 30.00    Pack years: 7.50    Types: Cigarettes    Quit date: 01/15/2017    Years since quitting: 2.0  . Smokeless tobacco: Never Used  . Tobacco comment: (10/30/18) currently only smokes 2-3 cigs/week  Substance and Sexual Activity  . Alcohol use: No    Alcohol/week: 0.0 standard drinks  . Drug use: No  . Sexual activity: Not on file  Lifestyle  . Physical activity    Days per week: Not on file    Minutes per session: Not on file  . Stress: Not on file  Relationships  . Social Herbalist on phone: Not on file    Gets together: Not on file    Attends religious service: Not on file    Active member of club or organization: Not on file    Attends meetings of clubs or organizations: Not on file    Relationship status: Not on file  . Intimate partner  violence    Fear of current or ex partner: Not on file    Emotionally abused: Not on file    Physically abused: Not on file    Forced sexual activity: Not on file  Other Topics Concern  . Not on file  Social History Narrative  . Not on file    Family History  Problem Relation Age of Onset  . Heart attack Father        Had Triple Bypass  . Lung disease Mother        Double lung transplant 2011     Current Outpatient Medications:  .  acetaminophen (TYLENOL) 325 MG tablet, Take 2 tablets (650 mg total) by mouth every 4 (four) hours., Disp: , Rfl:  .  ALPRAZolam (XANAX) 1 MG tablet, Take 1 mg by mouth at bedtime as needed for anxiety or sleep. prn, Disp: , Rfl:  .  budesonide (ENTOCORT EC) 3 MG 24 hr capsule, TAKE 3 CAPSULES (9 MG TOTAL) BY MOUTH DAILY., Disp: 90 capsule, Rfl: 0 .  Budesonide-Formoterol Fumarate (SYMBICORT IN), Inhale into the lungs as needed., Disp: , Rfl:  .  cetirizine (ZYRTEC) 10 MG tablet, Take 1 tablet by mouth every morning. , Disp: , Rfl:  .  omeprazole (PRILOSEC) 40 MG capsule, Take 40 mg by mouth daily as needed., Disp: , Rfl:  .  phentermine (ADIPEX-P) 37.5 MG tablet, Take 37.5 mg by mouth daily., Disp: , Rfl:  .  PROAIR HFA 108 (90 BASE) MCG/ACT inhaler, Inhale 2 puffs into the lungs every 4 (four) hours as needed for wheezing or shortness of breath. Every 4 to 6 hours prn, Disp: , Rfl: 3 .  Vitamin D, Ergocalciferol, (DRISDOL) 1.25 MG (50000 UT) CAPS capsule, Take 50,000 Units by mouth once a week., Disp: , Rfl:   No results found.  No images are attached to the encounter.   CMP Latest Ref Rng & Units 01/28/2019  Glucose 70 - 99 mg/dL 124(H)  BUN 6 - 20 mg/dL 11  Creatinine 0.44 - 1.00 mg/dL 0.88  Sodium 135 - 145 mmol/L 140  Potassium 3.5 - 5.1 mmol/L 3.5  Chloride 98 - 111 mmol/L 110  CO2 22 - 32 mmol/L 24  Calcium 8.9 - 10.3 mg/dL 9.0  Total Protein 6.5 - 8.1 g/dL 6.6  Total Bilirubin 0.3 - 1.2 mg/dL 0.3  Alkaline Phos 38 - 126 U/L 79  AST  15 - 41 U/L 17  ALT 0 - 44 U/L 19   CBC Latest Ref Rng & Units 01/28/2019  WBC 4.0 - 10.5 K/uL 8.2  Hemoglobin 12.0 - 15.0 g/dL 14.7  Hematocrit 36.0 - 46.0 % 44.9  Platelets 150 - 400 K/uL 225     Observation/objective: Appears in no acute distress on video visit today.  Breathing is nonlabored  Assessment and plan: Patient is a 53 year old female referred for polycythemia likely transient due to hemoconcentration which is now resolved  I discussed the results of the blood work with the patient in detail.  Her repeat CBC showed a normal H&H of 14.7/44.9. EPO levels were normal and JAK2 mutation testing was negative.  She therefore is not have polycythemia vera.  Currently her hemoglobin values are within normal range and it is possible that her hemoglobin was elevated at one-point possibly secondary to dehydration.  I would like to check her CBC one more time in 6 months time and if at that time if it is normal she can continue to follow-up with her primary care doctor.  Patient does give concerning signs and symptoms of possible obstructive sleep apnea I have asked her to get it evaluated regardless of her hemoglobin levels.  Patient verbalized understanding  Follow-up instructions: Return to clinic in 6 months to see Dr. Janese Banks with labs  I discussed the assessment and treatment plan with the patient. The patient was provided an opportunity to ask questions and all were answered. The patient agreed with the plan and demonstrated an understanding of the instructions.   The patient was advised to call back or seek an in-person evaluation if the symptoms worsen or if the condition fails to improve as anticipated.   Visit Diagnosis: 1. Polycythemia     Dr. Randa Evens, MD, MPH [facility] at [facility] [phone] 02/04/2019 9:12 AM

## 2019-02-10 ENCOUNTER — Telehealth: Payer: Self-pay | Admitting: Gastroenterology

## 2019-02-10 NOTE — Telephone Encounter (Signed)
Pt is calling for [place] she states she has been trying to get the Humira injection since Sept11th  She states she knows this has been pre approved because she has the pre approval letter in hand please  Call pt she states the specialty Pharmacy has not received any rx.

## 2019-02-10 NOTE — Telephone Encounter (Signed)
Spoke with pt and did a verbal with the pharmacy and pt will be contacted for delivery time.

## 2019-02-21 ENCOUNTER — Other Ambulatory Visit: Payer: Self-pay

## 2019-02-21 ENCOUNTER — Other Ambulatory Visit (INDEPENDENT_AMBULATORY_CARE_PROVIDER_SITE_OTHER): Payer: 59

## 2019-02-21 ENCOUNTER — Ambulatory Visit: Payer: 59 | Admitting: Gastroenterology

## 2019-02-21 DIAGNOSIS — K50012 Crohn's disease of small intestine with intestinal obstruction: Secondary | ICD-10-CM

## 2019-02-21 DIAGNOSIS — Z23 Encounter for immunization: Secondary | ICD-10-CM | POA: Diagnosis not present

## 2019-03-24 ENCOUNTER — Ambulatory Visit: Payer: 59 | Admitting: Gastroenterology

## 2019-04-01 ENCOUNTER — Telehealth: Payer: Self-pay

## 2019-04-02 NOTE — Telephone Encounter (Signed)
Advice note has been sent to Dr. Marius Ditch

## 2019-04-02 NOTE — Telephone Encounter (Signed)
As long as she does not have fever or worsening of symptoms, she should continue taking Humira.  She should contact her PCP for antibiotics if her sinus symptoms worsen or if she has any fever  Elizabeth Zhang

## 2019-04-03 NOTE — Telephone Encounter (Signed)
Spoke with pt and she states she has started a Z-pack prescribed by PCP and not had any fever, she will continue Humira at next scheduled in two weeks

## 2019-04-10 ENCOUNTER — Other Ambulatory Visit: Payer: Self-pay | Admitting: Gastroenterology

## 2019-06-03 ENCOUNTER — Ambulatory Visit: Payer: 59 | Admitting: Gastroenterology

## 2019-06-05 ENCOUNTER — Other Ambulatory Visit: Payer: Self-pay | Admitting: Gastroenterology

## 2019-06-17 NOTE — Telephone Encounter (Signed)
Last office visit 02/21/2019 Last refill 04/14/2019 2 refills

## 2019-07-04 ENCOUNTER — Other Ambulatory Visit: Payer: Self-pay

## 2019-07-07 ENCOUNTER — Other Ambulatory Visit: Payer: Self-pay

## 2019-07-07 ENCOUNTER — Ambulatory Visit (INDEPENDENT_AMBULATORY_CARE_PROVIDER_SITE_OTHER): Payer: 59 | Admitting: Gastroenterology

## 2019-07-07 ENCOUNTER — Encounter: Payer: Self-pay | Admitting: Gastroenterology

## 2019-07-07 VITALS — BP 132/63 | HR 103 | Temp 97.8°F | Ht 61.0 in | Wt 179.5 lb

## 2019-07-07 DIAGNOSIS — R140 Abdominal distension (gaseous): Secondary | ICD-10-CM | POA: Diagnosis not present

## 2019-07-07 DIAGNOSIS — Z23 Encounter for immunization: Secondary | ICD-10-CM

## 2019-07-07 DIAGNOSIS — K50012 Crohn's disease of small intestine with intestinal obstruction: Secondary | ICD-10-CM

## 2019-07-07 MED ORDER — HUMIRA (2 PEN) 40 MG/0.4ML ~~LOC~~ AJKT: 1.0000 "pen " | AUTO-INJECTOR | SUBCUTANEOUS | 1 refills | Status: DC

## 2019-07-07 MED ORDER — SHINGRIX 50 MCG/0.5ML IM SUSR: 0.5000 mL | Freq: Once | INTRAMUSCULAR | 0 refills | Status: AC

## 2019-07-07 MED ORDER — RIFAXIMIN 550 MG PO TABS: 550.0000 mg | ORAL_TABLET | Freq: Two times a day (BID) | ORAL | 0 refills | Status: AC

## 2019-07-07 NOTE — Progress Notes (Signed)
Cephas Darby, MD [address]  [place]  [place], [place] [address]  Main: [phone]  Fax: [phone]    Gastroenterology Consultation  Referring Provider:     Elgie Collard, MD Primary Care Physician:  Elgie Collard, MD Primary Gastroenterologist:  Dr. Cephas Darby Reason for Consultation:     Small bowel Crohn's, postop recurrence        HPI:   Elizabeth Zhang is a 54 y.o. female referred by Dr. Elgie Collard, MD  for consultation & management of postop recurrence of small bowel Crohn's  Patient initially saw me for screening colonoscopy on 11/06/2018.  She was found to have ileocolonic anastomosis with anastomotic stricture and unable to traverse.  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  Subsequently, she underwent CT enterography which revealed thickening of the neoterminal ileum up to 5 cm in length.  Patient has history of Crohn's disease at age 37, has been maintained on steroids for about 12 years and mesalamine derivatives.  She underwent ileocolonic resection in 1998 secondary to small bowel obstruction.  Since then, patient has not been on any maintenance therapy for Crohn's.  However, over the last 3 months she has been experiencing central abdominal pain associated with abdominal bloating, several episodes of dark bowel movements.  She reports anywhere from 10 to 12/day.  She reports feeling tired.  She also reports large joint arthralgias.  She lost about 10 to 12 pounds in last 3 months.  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  Patient had 2 pregnancies several years ago which were uneventful without any exacerbation of Crohn's.  She also had rectosigmoid surgery when she had hysterectomy complicated by perforation. The latest labs from 2016 revealed normal hemoglobin, mild leukocytosis  Follow-up visit 01/22/2019 She continues to have ongoing diarrhea, frequency  improved from 15 bowel movements a day to 10 bowel movements a day.  Taking Entocort 9 mg daily which has modestly improved her symptoms.  Her weight is stable.  Patient is accompanied by her mom today. Humira just got approved yesterday.  Waiting for it to be delivered.  She did not undergo fecal calprotectin test yet  Follow-up visit 07/07/2019 Patient started Humira biweekly in 02/2019.  She reports that she noticed significant improvement in her diarrhea and abdominal pain.  Her bowel movements have reduced to 4 times a day, nonbloody.  Her main concern today is abdominal bloating which is worse after eating.  Her weight has been stable.  She is tolerating Humira well.  She is not on budesonide.  Crohn's disease classification:  Age: < 17  Location: Ileal Behavior: stricturing  Perianal:  no  IBD diagnosis: At age 52  Disease course:Patient has history of Crohn's disease at age 40, has been maintained on steroids for about 12 years and mesalamine derivatives.  She underwent ileocolonic resection in 1998 secondary to small bowel obstruction.  Since then, patient has not been on any maintenance therapy for Crohn's.  However, over the last 3 months she has been experiencing central abdominal pain associated with abdominal bloating, several episodes of dark bowel movements.  She reports anywhere from 10 to 12/day.  She reports feeling tired.  She also reports large joint arthralgias.  She lost about 10 to 12 pounds in last 3 months.  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  Patient had 2 pregnancies several years ago which were uneventful without any  exacerbation of Crohn's.  She also had rectosigmoid surgery when she had hysterectomy complicated by perforation.  Extra intestinal manifestations: Large joint arthralgias  IBD surgical history: Ileocecal resection secondary to small bowel obstruction in 1998  Imaging:  MRE none CTE 12/04/2018 IMPRESSION: 1. Wall thickening in the distal 5  cm of the ileum and in the proximal colon suggesting mild to moderate enterocolitis. This could well be a manifestation of Crohn's disease. No abscess or extraluminal gas. 2. Partial colectomy with anastomotic staple line in the upper pelvis. 3. Otherwise normal appearance of the small bowel.  SBFT 05/03/2011 Findings raising concern of decreased peristalsis within  the distal and terminal ileum without further abnormalities.  Procedures:  Colonoscopy  11/06/2018 - Perianal skin tags found on perianal exam. - End-to-side ileo-colonic anastomosis, characterized by healthy appearing mucosa, unable to traverse the anastomosis. - Three 5 to 7 mm polyps in the ascending colon, removed with a hot snare. Resected and retrieved. - Patent functional end-to-end colo-rectal anastomosis, characterized by healthy appearing mucosa. - The distal rectum and anal verge are normal on retroflexion view.   Upper Endoscopy none  VCE none  IBD medications:  Steroids: Prednisone yes, budesonide yes 5-ASA: Previously on mesalamine Immunomodulators: AZA, methotrexate : None TPMT status unknown Biologics: Nave Anti TNFs: Anti Integrins: Ustekinumab: Tofactinib: Clinical trial:   Past Medical History:  Diagnosis Date  . Asthma   . Cesarean delivery delivered [place]   x2  . Crohn disease ([place])    1980  . Duplicated ureter, left   . HGSIL (high grade squamous intraepithelial lesion) on Pap smear of cervix     Past Surgical History:  Procedure Laterality Date  . ABDOMINAL HYSTERECTOMY N/A 12/23/2014   Procedure: HYSTERECTOMY ABDOMINAL REPAIR OF COLOTOMY;  Surgeon: Mellody Drown, MD;  Location: [facility];  Service: Gynecology;  Laterality: N/A;  . APPENDECTOMY    . [place]  . CHOLECYSTECTOMY    . COLON RESECTION N/A 1998  . COLONOSCOPY WITH PROPOFOL N/A 11/06/2018   Procedure: COLONOSCOPY WITH PROPOFOL;  Surgeon: Lin Landsman, MD;  Location: [place];  Service: Endoscopy;  Laterality: N/A;  . CYSTOSCOPY  12/23/2014   Procedure: CYSTOSCOPY;  Surgeon: Mellody Drown, MD;  Location: [facility];  Service: Gynecology;;  . HYSTEROSCOPY WITH D & C N/A 10/07/2014   Procedure: DILATATION AND CURETTAGE /HYSTEROSCOPY;  Surgeon: Mellody Drown, MD;  Location: [facility];  Service: Gynecology;  Laterality: N/A;  . LAPAROSCOPIC ASSISTED VAGINAL HYSTERECTOMY N/A 12/23/2014   Procedure: LAPAROSCOPIC ASSISTED VAGINAL HYSTERECTOMY, attempted;  Surgeon: Mellody Drown, MD;  Location: [facility];  Service: Gynecology;  Laterality: N/A;  . POLYPECTOMY N/A 11/06/2018   Procedure: POLYPECTOMY;  Surgeon: Lin Landsman, MD;  Location: [place];  Service: Endoscopy;  Laterality: N/A;  . URETER SURGERY Left 1979   Current Outpatient Medications:  .  acetaminophen (TYLENOL) 325 MG tablet, Take 2 tablets (650 mg total) by mouth every 4 (four) hours., Disp: , Rfl:  .  Adalimumab (HUMIRA PEN) 40 MG/0.4ML PNKT, Inject 1 pen as directed every 7 (seven) days., Disp: 2 each, Rfl: 1 .  ALPRAZolam (XANAX) 1 MG tablet, Take 1 mg by mouth at bedtime as needed for anxiety or sleep. prn, Disp: , Rfl:  .  Budesonide-Formoterol Fumarate (SYMBICORT IN), Inhale into the lungs as needed., Disp: , Rfl:  .  cetirizine (ZYRTEC) 10 MG tablet, Take 1 tablet by mouth every morning. , Disp: , Rfl:  .  citalopram (CELEXA) 10 MG tablet, Take 20 mg by mouth every morning., Disp: , Rfl:  .  Multiple Vitamins-Minerals (HAIR SKIN AND NAILS FORMULA) TABS, Take by mouth., Disp: , Rfl:  .  omeprazole (PRILOSEC) 40 MG capsule, Take 40 mg by mouth daily as needed., Disp: , Rfl:  .  phentermine (ADIPEX-P) 37.5 MG tablet, Take 37.5 mg by mouth daily., Disp: , Rfl:  .  PROAIR HFA 108 (90 BASE) MCG/ACT inhaler, Inhale 2 puffs into the lungs every 4 (four) hours as needed for wheezing or shortness of breath. Every 4 to 6 hours prn, Disp: , Rfl: 3 .  rifaximin (XIFAXAN) 550 MG TABS  tablet, Take 1 tablet (550 mg total) by mouth 2 (two) times daily for 14 days., Disp: 28 tablet, Rfl: 0 .  Zoster Vaccine Adjuvanted (SHINGRIX) injection, Inject 0.5 mLs into the muscle once for 1 dose., Disp: 0.5 mL, Rfl: 0   Family History  Problem Relation Age of Onset  . Heart attack Father        Had Triple Bypass  . Lung disease Mother        Double lung transplant 2011     Social History   Tobacco Use  . Smoking status: Former Smoker    Packs/day: 0.25    Years: 30.00    Pack years: 7.50    Types: Cigarettes    Quit date: 01/15/2017    Years since quitting: 2.4  . Smokeless tobacco: Never Used  . Tobacco comment: (10/30/18) currently only smokes 2-3 cigs/week  Substance Use Topics  . Alcohol use: No    Alcohol/week: 0.0 standard drinks  . Drug use: No    Allergies as of 07/07/2019 - Review Complete 07/07/2019  Allergen Reaction Noted  . Codeine Nausea And Vomiting 12/10/2014  . Percocet [oxycodone-acetaminophen] Nausea And Vomiting 12/10/2014  . Vicodin [hydrocodone-acetaminophen] Nausea And Vomiting 12/10/2014  . Morphine Hives and Nausea And Vomiting 09/16/2014  . Nickel Rash 09/16/2014  . Penicillin g Rash 09/16/2014    Review of Systems:    All systems reviewed and negative except where noted in HPI.   Physical Exam:  BP 132/63 (BP Location: Left Arm, Patient Position: Sitting, Cuff Size: Normal)   Pulse (!) 103   Temp 97.8 F (36.6 C) (Oral)   Ht 5' 1"  (1.549 m)   Wt 179 lb 8 oz (81.4 kg)   LMP 12/08/2014   BMI 33.92 kg/m  Patient's last menstrual period was 12/08/2014.  General:   Alert,  Well-developed, well-nourished, pleasant and cooperative in NAD Head:  Normocephalic and atraumatic. Eyes:  Sclera clear, no icterus.   Conjunctiva pink. Ears:  Normal auditory acuity. Nose:  No deformity, discharge, or lesions. Mouth:  No deformity or lesions,oropharynx pink & moist. Neck:  Supple; no masses or thyromegaly. Lungs:  Respirations even and  unlabored.  Clear throughout to auscultation.   No wheezes, crackles, or rhonchi. No acute distress. Heart:  Regular rate and rhythm; no murmurs, clicks, rubs, or gallops. Abdomen:  Normal bowel sounds. Soft, obese, non-tender and mildly distended, tympanic to percussion without masses, hepatosplenomegaly or hernias noted.  No guarding or rebound tenderness.   Rectal: Not performed Msk:  Symmetrical without gross deformities. Good, equal movement & strength bilaterally. Pulses:  Normal pulses noted. Extremities:  No clubbing or edema.  No cyanosis. Neurologic:  Alert and oriented x3;  grossly normal neurologically. Skin:  Intact without significant lesions or rashes. No jaundice. Psych:  Alert and cooperative. Normal mood and affect.  Imaging Studies: Reviewed  Assessment and Plan:   REIKO VINJE is a 54 y.o. female with history of small bowel Crohn's status post ileocecal resection is seen for follow-up of postop recurrence of small bowel Crohn's  Crohn's disease: Appears to be active and recurrent Crohn's based on the CT enterography and her clinical presentation, currently in partial clinical remission.  Patient notices worsening of symptoms before next dose of Humira CRP is normal, check fecal calprotectin Recommend to increase Humira to weekly due to presence of breakthrough symptoms Check adalimumab trough levels and antibodies Check CBC and CMP  Abdominal bloating Patient had received several courses of antibiotics for UTIs as a child when she had her ureters We will empirically treat for small intestinal bacterial overgrowth with 2 weeks course of rifaximin  IBD Health Maintenance  1.TB status: QuantiFERON gold negative 2. Anemia: She had polycythemia and elevated ferritin levels.  Family history of polycythemia.  Patient is evaluated by hematology, no evidence of polycythemia vera 3.Immunizations: Twinrix vaccine, first dose 01/22/2019, second dose 02/2019, third dose  06/2019, annual influenza vaccine, prevnar on 01/22/2019, pneumovax 06/2019, Varicella unknown, Shingrix received first dose on 06/14/2018, recommend booster, prescription given 4.Cancer screening I) Colon cancer/dysplasia surveillance: Up-to-date  II) Cervical cancer: Annual Pap smear up-to-date, normal III) Skin cancer - counseled about annual skin exam by dermatology and skin protection in summer using sun screen SPF > 50, clothing 5.Bone health Vitamin D status: Check levels during next visit Bone density testing: DEXA normal per patient 5. Labs: Ordered 6. Smoking: Never smoked 7. NSAIDs and Antibiotics use: None   Follow up in 2 months   Cephas Darby, MD

## 2019-07-21 ENCOUNTER — Ambulatory Visit: Payer: 59

## 2019-07-22 LAB — COMPREHENSIVE METABOLIC PANEL
ALT: 13 IU/L (ref 0–32)
AST: 14 IU/L (ref 0–40)
Albumin/Globulin Ratio: 1.8 (ref 1.2–2.2)
Albumin: 4 g/dL (ref 3.8–4.9)
Alkaline Phosphatase: 92 IU/L (ref 39–117)
BUN/Creatinine Ratio: 14 (ref 9–23)
BUN: 11 mg/dL (ref 6–24)
Bilirubin Total: 0.2 mg/dL (ref 0.0–1.2)
CO2: 23 mmol/L (ref 20–29)
Calcium: 9.3 mg/dL (ref 8.7–10.2)
Chloride: 109 mmol/L — ABNORMAL HIGH (ref 96–106)
Creatinine, Ser: 0.78 mg/dL (ref 0.57–1.00)
GFR calc Af Amer: 102 mL/min/{1.73_m2} (ref >59)
GFR calc non Af Amer: 88 mL/min/{1.73_m2} (ref >59)
Globulin, Total: 2.2 g/dL (ref 1.5–4.5)
Glucose: 95 mg/dL (ref 65–99)
Potassium: 4.4 mmol/L (ref 3.5–5.2)
Sodium: 144 mmol/L (ref 134–144)
Total Protein: 6.2 g/dL (ref 6.0–8.5)

## 2019-07-22 LAB — CBC
Hematocrit: 45.7 % (ref 34.0–46.6)
Hemoglobin: 15.8 g/dL (ref 11.1–15.9)
MCH: 31.7 pg (ref 26.6–33.0)
MCHC: 34.6 g/dL (ref 31.5–35.7)
MCV: 92 fL (ref 79–97)
Platelets: 210 10*3/uL (ref 150–450)
RBC: 4.98 x10E6/uL (ref 3.77–5.28)
RDW: 12.3 % (ref 11.7–15.4)
WBC: 8.3 10*3/uL (ref 3.4–10.8)

## 2019-07-22 LAB — ADALIMUMAB+AB (SERIAL MONITOR)
Adalimumab Drug Level: 9.2 ug/mL
Anti-Adalimumab Antibody: <25 ng/mL

## 2019-07-22 LAB — SERIAL MONITORING

## 2019-07-24 ENCOUNTER — Ambulatory Visit: Payer: 59 | Attending: Internal Medicine

## 2019-07-24 DIAGNOSIS — Z23 Encounter for immunization: Secondary | ICD-10-CM

## 2019-07-24 NOTE — Progress Notes (Signed)
   Covid-19 Vaccination Clinic  Name:  Elizabeth Zhang    MRN: [id number] DOB: 03-01-1966  07/24/2019  [name] was observed post Covid-19 immunization for 15 minutes without incident. She was provided with Vaccine Information Sheet and instruction to access the V-Safe system.   [name] was instructed to call 911 with any severe reactions post vaccine: Marland Kitchen Difficulty breathing  . Swelling of face and throat  . A fast heartbeat  . A bad rash all over body  . Dizziness and weakness   Immunizations Administered    Name Date Dose VIS Date Route   Pfizer COVID-19 Vaccine 07/24/2019 11:30 AM 0.3 mL 03/28/2019 Intramuscular   Manufacturer: [place]   Lot: [id number]   [place]: [id number]

## 2019-07-30 ENCOUNTER — Telehealth: Payer: Self-pay | Admitting: Oncology

## 2019-07-30 NOTE — Telephone Encounter (Signed)
Patient phoned and stated that she is being followed by Dr. Marius Ditch and does not feel that she needs to see Dr. Janese Banks at this time. Patient cancelled appt for 08-04-19 and stated that she will phone back to the [facility] if she feels that she needs to reschedule.

## 2019-08-04 ENCOUNTER — Inpatient Hospital Stay: Payer: 59 | Admitting: Oncology

## 2019-08-04 ENCOUNTER — Inpatient Hospital Stay: Payer: 59

## 2019-08-19 ENCOUNTER — Ambulatory Visit: Payer: 59 | Attending: Internal Medicine

## 2019-08-19 DIAGNOSIS — Z23 Encounter for immunization: Secondary | ICD-10-CM

## 2019-08-19 NOTE — Progress Notes (Signed)
   Covid-19 Vaccination Clinic  Name:  Elizabeth Zhang    MRN: [id number] DOB: 07-Mar-1966  08/19/2019  [name] was observed post Covid-19 immunization for 15 minutes without incident. She was provided with Vaccine Information Sheet and instruction to access the V-Safe system.   [name] was instructed to call 911 with any severe reactions post vaccine: Marland Kitchen Difficulty breathing  . Swelling of face and throat  . A fast heartbeat  . A bad rash all over body  . Dizziness and weakness   Immunizations Administered    Name Date Dose VIS Date Route   Pfizer COVID-19 Vaccine 08/19/2019 12:04 PM 0.3 mL 06/11/2018 Intramuscular   Manufacturer: [employer]   Lot: [id number]   [place]: [address]-6

## 2019-09-03 ENCOUNTER — Other Ambulatory Visit: Payer: Self-pay | Admitting: Gastroenterology

## 2019-10-06 ENCOUNTER — Other Ambulatory Visit: Payer: Self-pay

## 2019-10-07 ENCOUNTER — Ambulatory Visit: Payer: 59 | Admitting: Gastroenterology

## 2019-11-17 ENCOUNTER — Other Ambulatory Visit: Payer: Self-pay | Admitting: Gastroenterology

## 2019-11-17 NOTE — Telephone Encounter (Signed)
Last office visit 07/07/2019 crohn's disease  Last refill 09/03/2019 0 refills  Has appointment 01/06/2020

## 2019-12-01 ENCOUNTER — Telehealth: Payer: Self-pay

## 2019-12-01 NOTE — Telephone Encounter (Signed)
Patient is calling and states she is having major dental surgery. Patient wants to know If she needs to go off Humiria before the procedure and for how long

## 2019-12-01 NOTE — Telephone Encounter (Signed)
Called and left a message for call back  

## 2019-12-01 NOTE — Telephone Encounter (Signed)
Let's hold one dose before and one dose after surgery   RV

## 2019-12-02 NOTE — Telephone Encounter (Signed)
Patient called back and verbalized understanding of instructions

## 2019-12-02 NOTE — Telephone Encounter (Signed)
Called and left a message for call back. Sent mychart message

## 2020-01-05 ENCOUNTER — Other Ambulatory Visit: Payer: Self-pay

## 2020-01-06 ENCOUNTER — Ambulatory Visit (INDEPENDENT_AMBULATORY_CARE_PROVIDER_SITE_OTHER): Payer: 59 | Admitting: Gastroenterology

## 2020-01-06 ENCOUNTER — Other Ambulatory Visit: Payer: Self-pay

## 2020-01-06 ENCOUNTER — Encounter: Payer: Self-pay | Admitting: Gastroenterology

## 2020-01-06 VITALS — BP 143/71 | HR 102 | Temp 98.5°F | Ht 61.0 in | Wt 174.5 lb

## 2020-01-06 DIAGNOSIS — K50012 Crohn's disease of small intestine with intestinal obstruction: Secondary | ICD-10-CM | POA: Diagnosis not present

## 2020-01-06 NOTE — Progress Notes (Signed)
Cephas Darby, MD [address]  [place]  [place], [place] [address]  Main: [phone]  Fax: [phone]    Gastroenterology Consultation  Referring Provider:     Elgie Collard, MD Primary Care Physician:  Elgie Collard, MD Primary Gastroenterologist:  Dr. Cephas Darby Reason for Consultation:     Small bowel Crohn's, postop recurrence        HPI:   Elizabeth Zhang is a 54 y.o. female referred by Dr. Elgie Collard, MD  for consultation & management of postop recurrence of small bowel Crohn's  Patient initially saw me for screening colonoscopy on 11/06/2018.  She was found to have ileocolonic anastomosis with anastomotic stricture and unable to traverse.  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  Subsequently, she underwent CT enterography which revealed thickening of the neoterminal ileum up to 5 cm in length.  Patient has history of Crohn's disease at age 51, has been maintained on steroids for about 12 years and mesalamine derivatives.  She underwent ileocolonic resection in 1998 secondary to small bowel obstruction.  Since then, patient has not been on any maintenance therapy for Crohn's.  However, over the last 3 months she has been experiencing central abdominal pain associated with abdominal bloating, several episodes of dark bowel movements.  She reports anywhere from 10 to 12/day.  She reports feeling tired.  She also reports large joint arthralgias.  She lost about 10 to 12 pounds in last 3 months.  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  Patient had 2 pregnancies several years ago which were uneventful without any exacerbation of Crohn's.  She also had rectosigmoid surgery when she had hysterectomy complicated by perforation. The latest labs from 2016 revealed normal hemoglobin, mild leukocytosis  Follow-up visit 01/22/2019 She continues to have ongoing diarrhea, frequency  improved from 15 bowel movements a day to 10 bowel movements a day.  Taking Entocort 9 mg daily which has modestly improved her symptoms.  Her weight is stable.  Patient is accompanied by her mom today. Humira just got approved yesterday.  Waiting for it to be delivered.  She did not undergo fecal calprotectin test yet  Follow-up visit 07/07/2019 Patient started Humira biweekly in 02/2019.  She reports that she noticed significant improvement in her diarrhea and abdominal pain.  Her bowel movements have reduced to 4 times a day, nonbloody.  Her main concern today is abdominal bloating which is worse after eating.  Her weight has been stable.  She is tolerating Humira well.  She is not on budesonide.  Follow-up visit 01/06/2020 She feels great on weekly Humira.  She denies any breakthrough symptoms.  She had dental implants performed and feels good about it.  She had Humira weekly for 1 week after the procedure.  Reports having 1 formed bowel movement daily.  She is trying to lose weight.  She did receive Pfizer COVID-19 vaccine.  Crohn's disease classification:  Age: < 17  Location: Ileal Behavior: stricturing  Perianal:  no  IBD diagnosis: At age 17  Disease course:Patient has history of Crohn's disease at age 89, has been maintained on steroids for about 12 years and mesalamine derivatives.  She underwent ileocolonic resection in 1998 secondary to small bowel obstruction.  Since then, patient has not been on any maintenance therapy for Crohn's.  However, over the last 3 months she has been experiencing central abdominal pain associated with abdominal bloating,  several episodes of dark bowel movements.  She reports anywhere from 10 to 12/day.  She reports feeling tired.  She also reports large joint arthralgias.  She lost about 10 to 12 pounds in last 3 months.  She is an Art gallery manager for [employer].  Patient had 2 pregnancies several years ago which were uneventful without any exacerbation  of Crohn's.  She also had rectosigmoid surgery when she had hysterectomy complicated by perforation.  Maintained on Humira weekly  Extra intestinal manifestations: Large joint arthralgias  IBD surgical history: Ileocecal resection secondary to small bowel obstruction in 1998  Imaging:  MRE none CTE 12/04/2018 IMPRESSION: 1. Wall thickening in the distal 5 cm of the ileum and in the proximal colon suggesting mild to moderate enterocolitis. This could well be a manifestation of Crohn's disease. No abscess or extraluminal gas. 2. Partial colectomy with anastomotic staple line in the upper pelvis. 3. Otherwise normal appearance of the small bowel.  SBFT 05/03/2011 Findings raising concern of decreased peristalsis within  the distal and terminal ileum without further abnormalities.  Procedures:  Colonoscopy  11/06/2018 - Perianal skin tags found on perianal exam. - End-to-side ileo-colonic anastomosis, characterized by healthy appearing mucosa, unable to traverse the anastomosis. - Three 5 to 7 mm polyps in the ascending colon, removed with a hot snare. Resected and retrieved. - Patent functional end-to-end colo-rectal anastomosis, characterized by healthy appearing mucosa. - The distal rectum and anal verge are normal on retroflexion view.   Upper Endoscopy none  VCE none  IBD medications:  Steroids: Prednisone yes, budesonide yes 5-ASA: Previously on mesalamine Immunomodulators: AZA, methotrexate : None TPMT status unknown Biologics: Nave Anti TNFs: Anti Integrins: Ustekinumab: Tofactinib: Clinical trial:   Past Medical History:  Diagnosis Date  . Asthma   . Cesarean delivery delivered [place]   x2  . Crohn disease ([place])    1980  . Duplicated ureter, left   . HGSIL (high grade squamous intraepithelial lesion) on Pap smear of cervix     Past Surgical History:  Procedure Laterality Date  . ABDOMINAL HYSTERECTOMY N/A 12/23/2014   Procedure: HYSTERECTOMY  ABDOMINAL REPAIR OF COLOTOMY;  Surgeon: Mellody Drown, MD;  Location: [facility];  Service: Gynecology;  Laterality: N/A;  . APPENDECTOMY    . [place]  . CHOLECYSTECTOMY    . COLON RESECTION N/A 1998  . COLONOSCOPY WITH PROPOFOL N/A 11/06/2018   Procedure: COLONOSCOPY WITH PROPOFOL;  Surgeon: Lin Landsman, MD;  Location: [facility];  Service: Endoscopy;  Laterality: N/A;  . CYSTOSCOPY  12/23/2014   Procedure: CYSTOSCOPY;  Surgeon: Mellody Drown, MD;  Location: [facility];  Service: Gynecology;;  . HYSTEROSCOPY WITH D & C N/A 10/07/2014   Procedure: DILATATION AND CURETTAGE /HYSTEROSCOPY;  Surgeon: Mellody Drown, MD;  Location: [facility];  Service: Gynecology;  Laterality: N/A;  . LAPAROSCOPIC ASSISTED VAGINAL HYSTERECTOMY N/A 12/23/2014   Procedure: LAPAROSCOPIC ASSISTED VAGINAL HYSTERECTOMY, attempted;  Surgeon: Mellody Drown, MD;  Location: [facility];  Service: Gynecology;  Laterality: N/A;  . POLYPECTOMY N/A 11/06/2018   Procedure: POLYPECTOMY;  Surgeon: Lin Landsman, MD;  Location: [facility];  Service: Endoscopy;  Laterality: N/A;  . URETER SURGERY Left 1979   Current Outpatient Medications:  .  acetaminophen (TYLENOL) 325 MG tablet, Take 2 tablets (650 mg total) by mouth every 4 (four) hours., Disp: , Rfl:  .  ALPRAZolam (XANAX) 1 MG tablet, Take 1 mg by mouth at bedtime as needed for anxiety  or sleep. prn, Disp: , Rfl:  .  citalopram (CELEXA) 10 MG tablet, Take 20 mg by mouth every morning., Disp: , Rfl:  .  clotrimazole (MYCELEX) 10 MG troche, , Disp: , Rfl:  .  HUMIRA PEN 40 MG/0.4ML PNKT, INJECT 1 PEN UNDER THE SKIN EVERY 7 DAYS., Disp: 4 each, Rfl: 3 .  Multiple Vitamins-Minerals (HAIR SKIN AND NAILS FORMULA) TABS, Take by mouth., Disp: , Rfl:  .  omeprazole (PRILOSEC) 20 MG capsule, Take by mouth daily., Disp: , Rfl:  .  PROAIR HFA 108 (90 BASE) MCG/ACT inhaler, Inhale 2 puffs into the lungs every 4 (four) hours as needed for  wheezing or shortness of breath. Every 4 to 6 hours prn, Disp: , Rfl: 3 .  SYMBICORT 160-4.5 MCG/ACT inhaler, Inhale 2 puffs into the lungs 2 (two) times daily., Disp: , Rfl:  .  traZODone (DESYREL) 100 MG tablet, TAKE 1/2 TABLET EACH NIGHT AT BEDTIME FOR 2 WEEKS, THEN TAKE 1 TABLET EVERY NIGHT THERE AFTER, Disp: , Rfl:    Family History  Problem Relation Age of Onset  . Heart attack Father        Had Triple Bypass  . Lung disease Mother        Double lung transplant 2011     Social History   Tobacco Use  . Smoking status: Former Smoker    Packs/day: 0.25    Years: 30.00    Pack years: 7.50    Types: Cigarettes    Quit date: 01/15/2017    Years since quitting: 2.9  . Smokeless tobacco: Never Used  . Tobacco comment: (10/30/18) currently only smokes 2-3 cigs/week  Vaping Use  . Vaping Use: Never used  Substance Use Topics  . Alcohol use: No    Alcohol/week: 0.0 standard drinks  . Drug use: No    Allergies as of 01/06/2020 - Review Complete 01/06/2020  Allergen Reaction Noted  . Codeine Nausea And Vomiting 12/10/2014  . Percocet [oxycodone-acetaminophen] Nausea And Vomiting 12/10/2014  . Vicodin [hydrocodone-acetaminophen] Nausea And Vomiting 12/10/2014  . Morphine Hives and Nausea And Vomiting 09/16/2014  . Nickel Rash 09/16/2014  . Penicillin g Rash 09/16/2014    Review of Systems:    All systems reviewed and negative except where noted in HPI.   Physical Exam:  BP (!) 143/71 (BP Location: Left Arm, Patient Position: Sitting, Cuff Size: Normal)   Pulse (!) 102   Temp 98.5 F (36.9 C) (Oral)   Ht 5' 1"  (1.549 m)   Wt 174 lb 8 oz (79.2 kg)   LMP 12/08/2014   BMI 32.97 kg/m  Patient's last menstrual period was 12/08/2014.  General:   Alert,  Well-developed, well-nourished, pleasant and cooperative in NAD Head:  Normocephalic and atraumatic. Eyes:  Sclera clear, no icterus.   Conjunctiva pink. Ears:  Normal auditory acuity. Nose:  No deformity, discharge, or  lesions. Mouth:  No deformity or lesions,oropharynx pink & moist. Neck:  Supple; no masses or thyromegaly. Lungs:  Respirations even and unlabored.  Clear throughout to auscultation.   No wheezes, crackles, or rhonchi. No acute distress. Heart:  Regular rate and rhythm; no murmurs, clicks, rubs, or gallops. Abdomen:  Normal bowel sounds. Soft, obese, non-tender and nondistended without masses, hepatosplenomegaly or hernias noted.  No guarding or rebound tenderness.   Rectal: Not performed Msk:  Symmetrical without gross deformities. Good, equal movement & strength bilaterally. Pulses:  Normal pulses noted. Extremities:  No clubbing or edema.  No cyanosis. Neurologic:  Alert  and oriented x3;  grossly normal neurologically. Skin:  Intact without significant lesions or rashes. No jaundice. Psych:  Alert and cooperative. Normal mood and affect.  Imaging Studies: Reviewed  Assessment and Plan:   Elizabeth Zhang is a 54 y.o. female with history of small bowel Crohn's status post ileocecal resection is seen for follow-up of postop recurrence of small bowel Crohn's  Crohn's disease: Currently in clinical and endoscopic remission Last colonoscopy 2020 with no evidence of postop recurrence CRP is normal Continue weekly Humira, no evidence of adalimumab antibodies, trough levels in therapeutic range Check CBC and CMP every 6 months Patient was empirically treated with 2 weeks course of rifaximin for abdominal bloating from bacterial overgrowth Recommend colonoscopy sometime in 2022 or sooner if she develops any flareup  IBD Health Maintenance  1.TB status: QuantiFERON gold negative 2. Anemia: She had polycythemia and elevated ferritin levels.  Family history of polycythemia.  Patient is evaluated by hematology, no evidence of polycythemia vera 3.Immunizations: Twinrix vaccine, first dose 01/22/2019, second dose 02/2019, third dose 06/2019, annual influenza vaccine, prevnar on 01/22/2019,  pneumovax 06/2019, Varicella unknown, Shingrix received first dose on 06/14/2018, recommend booster, prescription given, recommend COVID-19 booster vaccine 4.Cancer screening I) Colon cancer/dysplasia surveillance: Up-to-date  II) Cervical cancer: Annual Pap smear up-to-date, normal III) Skin cancer - counseled about annual skin exam by dermatology and skin protection in summer using sun screen SPF > 50, clothing 5.Bone health Vitamin D status: Check levels today Bone density testing: DEXA normal per patient 5. Labs: Ordered 6. Smoking: Never smoked 7. NSAIDs and Antibiotics use: None   Follow up every 6 months  Cephas Darby, MD

## 2020-01-07 ENCOUNTER — Other Ambulatory Visit: Payer: Self-pay

## 2020-01-07 DIAGNOSIS — K50012 Crohn's disease of small intestine with intestinal obstruction: Secondary | ICD-10-CM

## 2020-01-07 LAB — COMPREHENSIVE METABOLIC PANEL
ALT: 17 IU/L (ref 0–32)
AST: 18 IU/L (ref 0–40)
Albumin/Globulin Ratio: 1.7 (ref 1.2–2.2)
Albumin: 4 g/dL (ref 3.8–4.9)
Alkaline Phosphatase: 99 IU/L (ref 44–121)
BUN/Creatinine Ratio: 10 (ref 9–23)
BUN: 7 mg/dL (ref 6–24)
Bilirubin Total: 0.3 mg/dL (ref 0.0–1.2)
CO2: 22 mmol/L (ref 20–29)
Calcium: 9.2 mg/dL (ref 8.7–10.2)
Chloride: 106 mmol/L (ref 96–106)
Creatinine, Ser: 0.71 mg/dL (ref 0.57–1.00)
GFR calc Af Amer: 113 mL/min/{1.73_m2} (ref >59)
GFR calc non Af Amer: 98 mL/min/{1.73_m2} (ref >59)
Globulin, Total: 2.3 g/dL (ref 1.5–4.5)
Glucose: 103 mg/dL — ABNORMAL HIGH (ref 65–99)
Potassium: 3.6 mmol/L (ref 3.5–5.2)
Sodium: 143 mmol/L (ref 134–144)
Total Protein: 6.3 g/dL (ref 6.0–8.5)

## 2020-01-07 LAB — HEPATIC FUNCTION PANEL: Bilirubin, Direct: 0.1 mg/dL (ref 0.00–0.40)

## 2020-01-07 LAB — CBC
Hematocrit: 42.1 % (ref 34.0–46.6)
Hemoglobin: 14.3 g/dL (ref 11.1–15.9)
MCH: 31.9 pg (ref 26.6–33.0)
MCHC: 34 g/dL (ref 31.5–35.7)
MCV: 94 fL (ref 79–97)
Platelets: 276 10*3/uL (ref 150–450)
RBC: 4.48 x10E6/uL (ref 3.77–5.28)
RDW: 12 % (ref 11.7–15.4)
WBC: 6.8 10*3/uL (ref 3.4–10.8)

## 2020-01-07 NOTE — Progress Notes (Unsigned)
Provider wanted to add on vitamin d levels

## 2020-01-12 ENCOUNTER — Encounter: Payer: Self-pay | Admitting: Gastroenterology

## 2020-01-12 ENCOUNTER — Telehealth: Payer: Self-pay

## 2020-01-12 LAB — VITAMIN D 25 HYDROXY (VIT D DEFICIENCY, FRACTURES): Vit D, 25-Hydroxy: 20.7 ng/mL — ABNORMAL LOW (ref 30.0–100.0)

## 2020-01-12 LAB — SPECIMEN STATUS REPORT

## 2020-01-12 NOTE — Telephone Encounter (Signed)
Yes can you do a letter stating she needs it every 7 days and why

## 2020-01-12 NOTE — Telephone Encounter (Signed)
She has been on Humira, why did they deny it?  Because, we changed it to weekly?  RV

## 2020-01-12 NOTE — Telephone Encounter (Signed)
Faxed letter to Urgent appeal department

## 2020-01-12 NOTE — Telephone Encounter (Signed)
Insurance company denied the Humira 33m. Please advised

## 2020-01-12 NOTE — Telephone Encounter (Signed)
Letter is done  RV

## 2020-01-19 ENCOUNTER — Telehealth: Payer: Self-pay

## 2020-01-19 NOTE — Telephone Encounter (Signed)
Emailed [name] to see if she could help [place] with the appeal on patient Elizabeth Zhang every 7 days. She reached out to [name] which handles all the PA's and appeal for this type of medication. He emailed me a sample appeal letter that we can re word for the patient. I have emailed this to you on your [name] email.

## 2020-01-22 ENCOUNTER — Telehealth: Payer: Self-pay

## 2020-01-22 NOTE — Telephone Encounter (Signed)
This is the telephone call for the e-mail I sent you

## 2020-01-22 NOTE — Telephone Encounter (Signed)
Faxed urgent appeal to [phone] to fax number [facility] gave [place]. Faxed letter that Dr. Marius Ditch and wrote and emailed to this department

## 2020-02-16 ENCOUNTER — Encounter: Payer: Self-pay | Admitting: Gastroenterology

## 2020-02-24 ENCOUNTER — Telehealth: Payer: Self-pay

## 2020-02-24 DIAGNOSIS — R300 Dysuria: Secondary | ICD-10-CM

## 2020-02-24 NOTE — Telephone Encounter (Signed)
Patient is calling because she states she had dental implants surgery on 12/16/2019. She said before the surgery she had some thrush in her mouth. She states it has gotten worse. She wants to know what she can do for it. She also thinks the Humiria has given her a UTI. Informed patient that she might need to go to PCP since she thinks she has UTI so they can get a urine sample. Informed her I would see what you recommend

## 2020-02-24 NOTE — Telephone Encounter (Signed)
Sent PA for Humira through cover my meds waiting on response from insurance company

## 2020-02-25 MED ORDER — NYSTATIN 100000 UNIT/ML MT SUSP: 5.0000 mL | Freq: Two times a day (BID) | OROMUCOSAL | 0 refills | Status: AC

## 2020-02-25 NOTE — Telephone Encounter (Signed)
Patient verbalized understanding and will pick up medication and get UA done. Order lab and sent medication to the pharmacy

## 2020-02-25 NOTE — Addendum Note (Signed)
Addended by: Ulyess Blossom L on: 02/25/2020 01:10 PM   Modules accepted: Orders

## 2020-02-25 NOTE — Telephone Encounter (Signed)
Recommend nystatin swish and swallow 2 times daily for 1 week for thrush Let us go ahead and order urine analysis  RV

## 2020-03-02 ENCOUNTER — Telehealth: Payer: Self-pay

## 2020-03-02 LAB — URINALYSIS
Bilirubin, UA: NEGATIVE
Glucose, UA: NEGATIVE
Ketones, UA: NEGATIVE
Nitrite, UA: POSITIVE — AB
Specific Gravity, UA: 1.021 (ref 1.005–1.030)
Urobilinogen, Ur: 0.2 mg/dL (ref 0.2–1.0)
pH, UA: 5.5 (ref 5.0–7.5)

## 2020-03-02 MED ORDER — SULFAMETHOXAZOLE-TRIMETHOPRIM 800-160 MG PO TABS: 1.0000 | ORAL_TABLET | Freq: Two times a day (BID) | ORAL | 0 refills | Status: AC

## 2020-03-02 NOTE — Telephone Encounter (Signed)
-----   Message from Lin Landsman, MD sent at 03/02/2020  8:03 AM EST ----- Start Bactrim DS BID for 3 days  If her symptoms don't improve or get worse, she should contact her pcp  Thanks  RV

## 2020-03-02 NOTE — Telephone Encounter (Signed)
Patient verbalized understanding of results. Sent medication to the pharmacy confirmed with patient which pharmacy to send it to

## 2020-03-04 MED ORDER — HUMIRA (2 PEN) 40 MG/0.4ML ~~LOC~~ AJKT: AUTO-INJECTOR | SUBCUTANEOUS | 1 refills | Status: DC

## 2020-03-04 NOTE — Telephone Encounter (Signed)
Sent medication to the pharmacy  

## 2020-08-23 ENCOUNTER — Other Ambulatory Visit: Payer: Self-pay | Admitting: Gastroenterology

## 2020-09-29 ENCOUNTER — Other Ambulatory Visit: Payer: Self-pay

## 2020-09-29 ENCOUNTER — Ambulatory Visit (INDEPENDENT_AMBULATORY_CARE_PROVIDER_SITE_OTHER): Payer: 59 | Admitting: Gastroenterology

## 2020-09-29 ENCOUNTER — Encounter: Payer: Self-pay | Admitting: Gastroenterology

## 2020-09-29 VITALS — BP 150/93 | HR 90 | Temp 98.3°F | Ht 61.0 in | Wt 174.1 lb

## 2020-09-29 DIAGNOSIS — K50012 Crohn's disease of small intestine with intestinal obstruction: Secondary | ICD-10-CM

## 2020-09-29 MED ORDER — CLENPIQ 10-3.5-12 MG-GM -GM/160ML PO SOLN: 320.0000 mL | Freq: Once | ORAL | 0 refills | Status: AC

## 2020-09-29 NOTE — Progress Notes (Signed)
Cephas Darby, MD [address]  [place]  [place], [place] [address]  Main: [phone]  Fax: [phone]    Gastroenterology Consultation  Referring Provider:     Elgie Collard, MD Primary Care Physician:  Elgie Collard, MD Primary Gastroenterologist:  Dr. Cephas Darby Reason for Consultation:     Small bowel Crohn's, postop recurrence        HPI:   Elizabeth Zhang is a 55 y.o. female referred by Dr. Elgie Collard, MD  for consultation & management of postop recurrence of small bowel Crohn's  Patient initially saw me for screening colonoscopy on 11/06/2018.  She was found to have ileocolonic anastomosis with anastomotic stricture and unable to traverse.  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  Subsequently, she underwent CT enterography which revealed thickening of the neoterminal ileum up to 5 cm in length.  Patient has history of Crohn's disease at age 24, has been maintained on steroids for about 12 years and mesalamine derivatives.  She underwent ileocolonic resection in 1998 secondary to small bowel obstruction.  Since then, patient has not been on any maintenance therapy for Crohn's.  However, over the last 3 months she has been experiencing central abdominal pain associated with abdominal bloating, several episodes of dark bowel movements.  She reports anywhere from 10 to 12/day.  She reports feeling tired.  She also reports large joint arthralgias.  She lost about 10 to 12 pounds in last 3 months.  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  Patient had 2 pregnancies several years ago which were uneventful without any exacerbation of Crohn's.  She also had rectosigmoid surgery when she had hysterectomy complicated by perforation. The latest labs from 2016 revealed normal hemoglobin, mild leukocytosis  Follow-up visit 01/22/2019 She continues to have ongoing diarrhea, frequency  improved from 15 bowel movements a day to 10 bowel movements a day.  Taking Entocort 9 mg daily which has modestly improved her symptoms.  Her weight is stable.  Patient is accompanied by her mom today. Humira just got approved yesterday.  Waiting for it to be delivered.  She did not undergo fecal calprotectin test yet  Follow-up visit 07/07/2019 Patient started Humira biweekly in 02/2019.  She reports that she noticed significant improvement in her diarrhea and abdominal pain.  Her bowel movements have reduced to 4 times a day, nonbloody.  Her main concern today is abdominal bloating which is worse after eating.  Her weight has been stable.  She is tolerating Humira well.  She is not on budesonide.  Follow-up visit 01/06/2020 She feels great on weekly Humira.  She denies any breakthrough symptoms.  She had dental implants performed and feels good about it.  She had Humira weekly for 1 week after the procedure.  Reports having 1 formed bowel movement daily.  She is trying to lose weight.  She did receive Pfizer COVID-19 vaccine.  Follow-up visit 09/29/2020 Patient is here for follow-up of her Crohn's disease.  She reports doing well overall.  She is currently on Humira every other week as her insurance did not allow her to continue weekly Humira.  However, patient denies any breakthrough symptoms.  Patient lost her mother in February and is gradually adjusting to her loss.  She reports that she is back to her normal life. I have treated her for UTI and oral thrush in November 2021  Crohn's disease classification:  Age: < 17  Location: Ileal Behavior: stricturing  Perianal:  no  IBD diagnosis: At age 11  Disease course:Patient has history of Crohn's disease at age 72, has been maintained on steroids for about 12 years and mesalamine derivatives.  She underwent ileocolonic resection in 1998 secondary to small bowel obstruction.  Since then, patient has not been on any maintenance therapy for Crohn's.   However, over the last 3 months she has been experiencing central abdominal pain associated with abdominal bloating, several episodes of dark bowel movements.  She reports anywhere from 10 to 12/day.  She reports feeling tired.  She also reports large joint arthralgias.  She lost about 10 to 12 pounds in last 3 months.  She is an Art gallery manager for [employer].  Patient had 2 pregnancies several years ago which were uneventful without any exacerbation of Crohn's.  She also had rectosigmoid surgery when she had hysterectomy complicated by perforation.  Maintained on Humira weekly.  Switched to Humira biweekly in November 2021  Extra intestinal manifestations: Large joint arthralgias  IBD surgical history: Ileocecal resection secondary to small bowel obstruction in 1998  Imaging:  MRE none CTE 12/04/2018 IMPRESSION: 1. Wall thickening in the distal 5 cm of the ileum and in the proximal colon suggesting mild to moderate enterocolitis. This could well be a manifestation of Crohn's disease. No abscess or extraluminal gas. 2. Partial colectomy with anastomotic staple line in the upper pelvis. 3. Otherwise normal appearance of the small bowel.  SBFT 05/03/2011 Findings raising concern of decreased peristalsis within  the distal and terminal ileum without further abnormalities.  Procedures:  Colonoscopy  11/06/2018 - Perianal skin tags found on perianal exam. - End-to-side ileo-colonic anastomosis, characterized by healthy appearing mucosa, unable to traverse the anastomosis. - Three 5 to 7 mm polyps in the ascending colon, removed with a hot snare. Resected and retrieved. - Patent functional end-to-end colo-rectal anastomosis, characterized by healthy appearing mucosa. - The distal rectum and anal verge are normal on retroflexion view.   Upper Endoscopy none  VCE none  IBD medications:  Steroids: Prednisone yes, budesonide yes 5-ASA: Previously on mesalamine Immunomodulators:  AZA, methotrexate : None TPMT status unknown Biologics: Anti TNFs: Humira biweekly started since November 2020 Anti Integrins: Ustekinumab: Tofactinib: Clinical trial:   Past Medical History:  Diagnosis Date   Asthma    Cesarean delivery delivered [place]   x2   Crohn disease ([place])    0240   Duplicated ureter, left    HGSIL (high grade squamous intraepithelial lesion) on Pap smear of cervix     Past Surgical History:  Procedure Laterality Date   ABDOMINAL HYSTERECTOMY N/A 12/23/2014   Procedure: HYSTERECTOMY ABDOMINAL REPAIR OF COLOTOMY;  Surgeon: Mellody Drown, MD;  Location: [facility];  Service: Gynecology;  Laterality: N/A;   APPENDECTOMY     CESAREAN SECTION N/A [place]   COLONOSCOPY WITH PROPOFOL N/A 11/06/2018   Procedure: COLONOSCOPY WITH PROPOFOL;  Surgeon: Lin Landsman, MD;  Location: [facility];  Service: Endoscopy;  Laterality: N/A;   CYSTOSCOPY  12/23/2014   Procedure: CYSTOSCOPY;  Surgeon: Mellody Drown, MD;  Location: [facility];  Service: Gynecology;;   HYSTEROSCOPY WITH D & C N/A 10/07/2014   Procedure: DILATATION AND CURETTAGE /HYSTEROSCOPY;  Surgeon: Mellody Drown, MD;  Location: [facility];  Service: Gynecology;  Laterality: N/A;   LAPAROSCOPIC ASSISTED VAGINAL HYSTERECTOMY N/A 12/23/2014   Procedure: LAPAROSCOPIC ASSISTED  VAGINAL HYSTERECTOMY, attempted;  Surgeon: Mellody Drown, MD;  Location: [facility];  Service: Gynecology;  Laterality: N/A;   POLYPECTOMY N/A 11/06/2018   Procedure: POLYPECTOMY;  Surgeon: Lin Landsman, MD;  Location: [facility];  Service: Endoscopy;  Laterality: N/A;   URETER SURGERY Left 1979   Current Outpatient Medications:    acetaminophen (TYLENOL) 325 MG tablet, Take 2 tablets (650 mg total) by mouth every 4 (four) hours., Disp: , Rfl:    ALPRAZolam (XANAX) 1 MG tablet, Take 1 mg by mouth at bedtime as needed for anxiety or sleep. prn, Disp: , Rfl:     HUMIRA PEN 40 MG/0.4ML PNKT, INJECT 1 PEN UNDER THE SKIN EVERY 14 DAYS., Disp: 2 each, Rfl: 0   Multiple Vitamins-Minerals (HAIR SKIN AND NAILS FORMULA) TABS, Take by mouth., Disp: , Rfl:    omeprazole (PRILOSEC) 20 MG capsule, Take by mouth daily., Disp: , Rfl:    PROAIR HFA 108 (90 BASE) MCG/ACT inhaler, Inhale 2 puffs into the lungs every 4 (four) hours as needed for wheezing or shortness of breath. Every 4 to 6 hours prn, Disp: , Rfl: 3   Sod Picosulfate-Mag Ox-Cit Acd (CLENPIQ) 10-3.5-12 MG-GM -GM/160ML SOLN, Take 320 mLs by mouth once for 1 dose., Disp: 320 mL, Rfl: 0   SYMBICORT 160-4.5 MCG/ACT inhaler, Inhale 2 puffs into the lungs 2 (two) times daily., Disp: , Rfl:    traZODone (DESYREL) 100 MG tablet, TAKE 1/2 TABLET EACH NIGHT AT BEDTIME FOR 2 WEEKS, THEN TAKE 1 TABLET EVERY NIGHT THERE AFTER, Disp: , Rfl:    Vitamin D, Ergocalciferol, (DRISDOL) 1.25 MG (50000 UNIT) CAPS capsule, Take 1 capsule by mouth once a week., Disp: , Rfl:    Family History  Problem Relation Age of Onset   Heart attack Father        Had Triple Bypass   Lung disease Mother        Double lung transplant 2011     Social History   Tobacco Use   Smoking status: Former    Packs/day: 0.25    Years: 30.00    Pack years: 7.50    Types: Cigarettes    Quit date: 01/15/2017    Years since quitting: 3.7   Smokeless tobacco: Never   Tobacco comments:    (10/30/18) currently only smokes 2-3 cigs/week  Vaping Use   Vaping Use: Never used  Substance Use Topics   Alcohol use: No    Alcohol/week: 0.0 standard drinks   Drug use: No    Allergies as of 09/29/2020 - Review Complete 09/29/2020  Allergen Reaction Noted   Codeine Nausea And Vomiting 12/10/2014   Percocet [oxycodone-acetaminophen] Nausea And Vomiting 12/10/2014   Vicodin [hydrocodone-acetaminophen] Nausea And Vomiting 12/10/2014   Morphine Hives and Nausea And Vomiting 09/16/2014   Nickel Rash 09/16/2014   Penicillin g Rash 09/16/2014    Review  of Systems:    All systems reviewed and negative except where noted in HPI.   Physical Exam:  BP (!) 150/93 (BP Location: Left Arm, Patient Position: Sitting, Cuff Size: Normal)   Pulse 90   Temp 98.3 F (36.8 C) (Oral)   Ht 5' 1"  (1.549 m)   Wt 174 lb 2 oz (79 kg)   LMP 12/08/2014   BMI 32.90 kg/m  Patient's last menstrual period was 12/08/2014.  General:   Alert,  Well-developed, well-nourished, pleasant and cooperative in NAD Head:  Normocephalic and atraumatic. Eyes:  Sclera clear, no icterus.   Conjunctiva pink. Ears:  Normal auditory acuity. Nose:  No deformity, discharge, or lesions. Mouth:  No deformity or lesions,oropharynx pink & moist. Neck:  Supple; no masses or thyromegaly. Lungs:  Respirations even and unlabored.  Clear throughout to auscultation.   No wheezes, crackles, or rhonchi. No acute distress. Heart:  Regular rate and rhythm; no murmurs, clicks, rubs, or gallops. Abdomen:  Normal bowel sounds. Soft, obese, non-tender and nondistended without masses, hepatosplenomegaly or hernias noted.  No guarding or rebound tenderness.   Rectal: Not performed Msk:  Symmetrical without gross deformities. Good, equal movement & strength bilaterally. Pulses:  Normal pulses noted. Extremities:  No clubbing or edema.  No cyanosis. Neurologic:  Alert and oriented x3;  grossly normal neurologically. Skin:  Intact without significant lesions or rashes. No jaundice. Psych:  Alert and cooperative. Normal mood and affect.  Imaging Studies: Reviewed  Assessment and Plan:   Elizabeth Zhang is a 55 y.o. female with history of small bowel Crohn's status post ileocecal resection is seen for follow-up of postop recurrence of small bowel Crohn's  Crohn's disease: Currently in clinical and endoscopic remission Last colonoscopy 2020 with no evidence of postop recurrence CRP is normal Patient was on weekly Humira, switched back to biweekly Humira since weekly Humira has been denied by  her insurance despite several appeals, no evidence of adalimumab antibodies, trough levels in therapeutic range Check CBC and CMP every 6 months Patient was empirically treated with 2 weeks course of rifaximin for abdominal bloating from bacterial overgrowth Recommend colonoscopy to assess postop recurrence of Crohn's disease  IBD Health Maintenance  1.TB status: QuantiFERON gold negative 2. Anemia: She had polycythemia and elevated ferritin levels.  Family history of polycythemia.  Patient is evaluated by hematology, no evidence of polycythemia vera 3.Immunizations: Twinrix vaccine, first dose 01/22/2019, second dose 02/2019, third dose 06/2019, annual influenza vaccine, prevnar on 01/22/2019, pneumovax 06/2019, Varicella unknown, Shingrix received first dose on 06/14/2018, recommend booster, prescription given, recommend COVID-19 booster vaccine 4.Cancer screening I) Colon cancer/dysplasia surveillance: Up-to-date  II) Cervical cancer: Annual Pap smear up-to-date, normal III) Skin cancer - counseled about annual skin exam by dermatology and skin protection in summer using sun screen SPF > 50, clothing 5.Bone health Vitamin D status: Check levels today Bone density testing: DEXA normal per patient 5. Labs: Ordered 6. Smoking: Never smoked 7. NSAIDs and Antibiotics use: None   Follow up every 6 months  Cephas Darby, MD

## 2020-09-30 LAB — COMPREHENSIVE METABOLIC PANEL
ALT: 17 IU/L (ref 0–32)
AST: 22 IU/L (ref 0–40)
Albumin/Globulin Ratio: 1.7 (ref 1.2–2.2)
Albumin: 4.3 g/dL (ref 3.8–4.9)
Alkaline Phosphatase: 96 IU/L (ref 44–121)
BUN/Creatinine Ratio: 10 (ref 9–23)
BUN: 8 mg/dL (ref 6–24)
Bilirubin Total: <0.2 mg/dL (ref 0.0–1.2)
CO2: 20 mmol/L (ref 20–29)
Calcium: 9.3 mg/dL (ref 8.7–10.2)
Chloride: 105 mmol/L (ref 96–106)
Creatinine, Ser: 0.79 mg/dL (ref 0.57–1.00)
Globulin, Total: 2.5 g/dL (ref 1.5–4.5)
Glucose: 85 mg/dL (ref 65–99)
Potassium: 4.2 mmol/L (ref 3.5–5.2)
Sodium: 141 mmol/L (ref 134–144)
Total Protein: 6.8 g/dL (ref 6.0–8.5)
eGFR: 90 mL/min/{1.73_m2} (ref >59)

## 2020-09-30 LAB — CBC
Hematocrit: 49.3 % — ABNORMAL HIGH (ref 34.0–46.6)
Hemoglobin: 16.1 g/dL — ABNORMAL HIGH (ref 11.1–15.9)
MCH: 30.4 pg (ref 26.6–33.0)
MCHC: 32.7 g/dL (ref 31.5–35.7)
MCV: 93 fL (ref 79–97)
Platelets: 197 10*3/uL (ref 150–450)
RBC: 5.3 x10E6/uL — ABNORMAL HIGH (ref 3.77–5.28)
RDW: 12 % (ref 11.7–15.4)
WBC: 6.9 10*3/uL (ref 3.4–10.8)

## 2020-09-30 LAB — VITAMIN D 25 HYDROXY (VIT D DEFICIENCY, FRACTURES): Vit D, 25-Hydroxy: 43.3 ng/mL (ref 30.0–100.0)

## 2020-10-21 ENCOUNTER — Other Ambulatory Visit: Payer: Self-pay | Admitting: Gastroenterology

## 2020-11-03 ENCOUNTER — Encounter: Payer: Self-pay | Admitting: Gastroenterology

## 2020-11-03 ENCOUNTER — Ambulatory Visit
Admission: RE | Admit: 2020-11-03 | Discharge: 2020-11-03 | Disposition: A | Discharge status: Home / Self Care | Payer: 59 | Attending: Gastroenterology | Admitting: Gastroenterology

## 2020-11-03 ENCOUNTER — Telehealth: Payer: Self-pay

## 2020-11-03 ENCOUNTER — Other Ambulatory Visit: Payer: Self-pay | Admitting: Gastroenterology

## 2020-11-03 ENCOUNTER — Encounter
Admission: RE | Disposition: A | Discharge status: Home / Self Care | Payer: Self-pay | Source: Home / Self Care | Attending: Gastroenterology

## 2020-11-03 ENCOUNTER — Ambulatory Visit: Payer: 59 | Admitting: Certified Registered"

## 2020-11-03 DIAGNOSIS — Z980 Intestinal bypass and anastomosis status: Secondary | ICD-10-CM | POA: Insufficient documentation

## 2020-11-03 DIAGNOSIS — K50012 Crohn's disease of small intestine with intestinal obstruction: Secondary | ICD-10-CM

## 2020-11-03 DIAGNOSIS — K5000 Crohn's disease of small intestine without complications: Secondary | ICD-10-CM | POA: Insufficient documentation

## 2020-11-03 HISTORY — PX: COLONOSCOPY WITH PROPOFOL: SHX5780

## 2020-11-03 SURGERY — COLONOSCOPY WITH PROPOFOL
Anesthesia: General

## 2020-11-03 MED ORDER — PROPOFOL 500 MG/50ML IV EMUL
INTRAVENOUS | Status: AC
  Filled 2020-11-03: qty 400

## 2020-11-03 MED ORDER — SODIUM CHLORIDE 0.9 % IV SOLN: INTRAVENOUS | Status: DC

## 2020-11-03 MED ORDER — GLYCOPYRROLATE 0.2 MG/ML IJ SOLN
INTRAMUSCULAR | Status: DC | PRN
  Administered 2020-11-03: .2 mg via INTRAVENOUS

## 2020-11-03 MED ORDER — LIDOCAINE HCL (CARDIAC) PF 100 MG/5ML IV SOSY
PREFILLED_SYRINGE | INTRAVENOUS | Status: DC | PRN
  Administered 2020-11-03: 100 mg via INTRAVENOUS

## 2020-11-03 MED ORDER — PHENYLEPHRINE HCL (PRESSORS) 10 MG/ML IV SOLN
INTRAVENOUS | Status: AC
  Filled 2020-11-03: qty 1

## 2020-11-03 MED ORDER — PROPOFOL 500 MG/50ML IV EMUL
INTRAVENOUS | Status: DC | PRN
  Administered 2020-11-03: 155 ug/kg/min via INTRAVENOUS

## 2020-11-03 MED ORDER — PROPOFOL 10 MG/ML IV BOLUS
INTRAVENOUS | Status: DC | PRN
  Administered 2020-11-03: 60 mg via INTRAVENOUS

## 2020-11-03 NOTE — Op Note (Signed)
[facility] Gastroenterology Patient Name: Elizabeth Zhang Procedure Date: 11/03/2020 8:03 AM MRN: [id number] Account #: [id number] Date of Birth: 28-Jul-1965 Admit Type: Outpatient Age: 55 Room: [facility] ENDO ROOM 4 Gender: Female Note Status: Finalized Procedure:             Colonoscopy Indications:           Last colonoscopy: July 2020, Disease activity                         assessment of Crohn's disease of the small bowel,                         Assess therapeutic response to therapy of Crohn's                         disease of the small bowel Providers:             Lin Landsman MD, MD Referring MD:          Shelby Mattocks. Georga Bora, MD (Referring MD) Medicines:             General Anesthesia Complications:         No immediate complications. Estimated blood loss: None. Procedure:             Pre-Anesthesia Assessment:                        - Prior to the procedure, a History and Physical was                         performed, and patient medications and allergies were                         reviewed. The patient is competent. The risks and                         benefits of the procedure and the sedation options and                         risks were discussed with the patient. All questions                         were answered and informed consent was obtained.                         Patient identification and proposed procedure were                         verified by the physician, the nurse, the                         anesthesiologist, the anesthetist and the technician                         in the pre-procedure area in the procedure room in the                         endoscopy suite. Mental Status Examination: alert and  oriented. Airway Examination: normal oropharyngeal                         airway and neck mobility. Respiratory Examination:                         clear to auscultation. CV Examination: normal.                          Prophylactic Antibiotics: The patient does not require                         prophylactic antibiotics. Prior Anticoagulants: The                         patient has taken no previous anticoagulant or                         antiplatelet agents. ASA Grade Assessment: III - A                         patient with severe systemic disease. After reviewing                         the risks and benefits, the patient was deemed in                         satisfactory condition to undergo the procedure. The                         anesthesia plan was to use general anesthesia.                         Immediately prior to administration of medications,                         the patient was re-assessed for adequacy to receive                         sedatives. The heart rate, respiratory rate, oxygen                         saturations, blood pressure, adequacy of pulmonary                         ventilation, and response to care were monitored                         throughout the procedure. The physical status of the                         patient was re-assessed after the procedure.                        After obtaining informed consent, the colonoscope was                         passed under direct vision. Throughout the procedure,  the patient's blood pressure, pulse, and oxygen                         saturations were monitored continuously. The                         Colonoscope was introduced through the anus and                         advanced to the the ileocolonic anastomosis. The                         colonoscopy was performed without difficulty. The                         patient tolerated the procedure well. The quality of                         the bowel preparation was evaluated using the BBPS                         [facility] Bowel Preparation Scale) with scores of: Right                         Colon = 3, Transverse Colon = 3 and  Left Colon = 3                         (entire mucosa seen well with no residual staining,                         small fragments of stool or opaque liquid). The total                         BBPS score equals 9. Findings:      The perianal and digital rectal examinations were normal. Pertinent       negatives include normal sphincter tone and no palpable rectal lesions.      There was evidence of a prior end-to-side ileo-colonic anastomosis in       the ascending colon. This was unable to locate the opening of       neoterminal ileum despite several attempts and switching scope to upper       endoscope with retroflexion. The anastomosis could not be located or       traversed.      The entire examined colon appeared normal.      The retroflexed view of the distal rectum and anal verge was normal and       showed no anal or rectal abnormalities.      There was evidence of a prior functional end-to-end colo-colonic       anastomosis in the recto-sigmoid colon. This was patent and was       characterized by healthy appearing mucosa. The anastomosis was traversed. Impression:            - Unable to locate the opening of neoterminal ileum                         end-to-side ileo-colonic anastomosis, characterized by  unknown.                        - The entire examined colon is normal.                        - The distal rectum and anal verge are normal on                         retroflexion view.                        - Patent functional end-to-end colo-colonic                         anastomosis, characterized by healthy appearing mucosa.                        - No specimens collected. Recommendation:        - Discharge patient to home (with escort).                        - Resume previous diet today.                        - Continue present medications.                        - Repeat colonoscopy in 5 years for surveillance.                        - Return  to my office as previously scheduled.                        - Perform a small bowel follow through at appointment                         to be scheduled.                        - Check fecal calprotectin levels Procedure Code(s):     --- Professional ---                        [phone], Colonoscopy, flexible; diagnostic, including                         collection of specimen(s) by brushing or washing, when                         performed (separate procedure) Diagnosis Code(s):     --- Professional ---                        Z98.0, Intestinal bypass and anastomosis status                        K50.00, Crohn's disease of small intestine without                         complications CPT copyright 2019 American Medical Association. All rights reserved. The codes documented in this report are  preliminary and upon coder review may  be revised to meet current compliance requirements. Dr. Ulyess Mort Lin Landsman MD, MD 11/03/2020 8:43:49 AM This report has been signed electronically. Number of Addenda: 0 Note Initiated On: 11/03/2020 8:03 AM Scope Withdrawal Time: 0 hours 25 minutes 37 seconds  Total Procedure Duration: 0 hours 27 minutes 5 seconds  Estimated Blood Loss:  Estimated blood loss: none.      [facility]

## 2020-11-03 NOTE — OR Nursing (Signed)
Surgical Anastomosis reached at 0810.

## 2020-11-03 NOTE — Anesthesia Postprocedure Evaluation (Signed)
Anesthesia Post Note  Patient: JEREMIAH TARPLEY  Procedure(s) Performed: COLONOSCOPY WITH PROPOFOL  Patient location during evaluation: Endoscopy Anesthesia Type: General Level of consciousness: awake and alert Pain management: pain level controlled Vital Signs Assessment: post-procedure vital signs reviewed and stable Respiratory status: spontaneous breathing, nonlabored ventilation, respiratory function stable and patient connected to nasal cannula oxygen Cardiovascular status: blood pressure returned to baseline and stable Postop Assessment: no apparent nausea or vomiting Anesthetic complications: no   No notable events documented.   Last Vitals:  Vitals:   11/03/20 0852 11/03/20 0901  BP: (!) 132/93 (!) 136/99  Pulse: (!) 107 99  Resp: (!) 22 19  Temp:    SpO2: 94% 96%    Last Pain:  Vitals:   11/03/20 0901  TempSrc:   PainSc: 0-No pain                 Precious Haws Frona Yost

## 2020-11-03 NOTE — Telephone Encounter (Signed)
Patient verbalized understanding of instructions  

## 2020-11-03 NOTE — Anesthesia Preprocedure Evaluation (Signed)
Anesthesia Evaluation  Patient identified by MRN, date of birth, ID band Patient awake    Reviewed: Allergy & Precautions, NPO status , Patient's Chart, lab work & pertinent test results  History of Anesthesia Complications Negative for: history of anesthetic complications  Airway Mallampati: III  TM Distance: >3 FB Neck ROM: Full    Dental  (+) Implants, Chipped   Pulmonary asthma , Current Smoker (3 cigarette per week) and Patient abstained from smoking., former smoker,    Pulmonary exam normal        Cardiovascular Exercise Tolerance: Good (-) Past MI and (-) DOE Normal cardiovascular exam     Neuro/Psych negative neurological ROS  negative psych ROS   GI/Hepatic Neg liver ROS, GERD  Medicated and Controlled,Crohn's disease   Endo/Other  negative endocrine ROS  Renal/GU negative Renal ROS  negative genitourinary   Musculoskeletal   Abdominal   Peds  Hematology negative hematology ROS (+)   Anesthesia Other Findings Past Medical History: No date: Asthma 1993 & 1998: Cesarean delivery delivered     Comment:  x2 No date: Crohn disease ([place])     Comment:  3220 No date: Duplicated ureter, left No date: HGSIL (high grade squamous intraepithelial lesion) on Pap  smear of cervix  Past Surgical History: 12/23/2014: ABDOMINAL HYSTERECTOMY; N/A     Comment:  Procedure: HYSTERECTOMY ABDOMINAL REPAIR OF COLOTOMY;                Surgeon: Mellody Drown, MD;  Location: [facility];                Service: Gynecology;  Laterality: N/A; No date: [place]; N/A No date: CHOLECYSTECTOMY 04/17/1996: COLON RESECTION; N/A 11/06/2018: COLONOSCOPY WITH PROPOFOL; N/A     Comment:  Procedure: COLONOSCOPY WITH PROPOFOL;  Surgeon: Lin Landsman, MD;  Location: [place];                Service: Endoscopy;  Laterality: N/A; 12/23/2014: CYSTOSCOPY     Comment:   Procedure: CYSTOSCOPY;  Surgeon: Mellody Drown, MD;                Location: [facility];  Service: Gynecology;; No date: EYE SURGERY 10/07/2014: HYSTEROSCOPY WITH D & C; N/A     Comment:  Procedure: DILATATION AND CURETTAGE /HYSTEROSCOPY;                Surgeon: Mellody Drown, MD;  Location: [facility];                Service: Gynecology;  Laterality: N/A; 12/23/2014: LAPAROSCOPIC ASSISTED VAGINAL HYSTERECTOMY; N/A     Comment:  Procedure: LAPAROSCOPIC ASSISTED VAGINAL HYSTERECTOMY,               attempted;  Surgeon: Mellody Drown, MD;  Location: [facility]              [facility];  Service: Gynecology;  Laterality: N/A; 11/06/2018: POLYPECTOMY; N/A     Comment:  Procedure: POLYPECTOMY;  Surgeon: Lin Landsman,               MD;  Location: [place];  Service: Endoscopy;               Laterality: N/A; 04/17/1977: URETER SURGERY; Left  BMI    Body Mass Index: 31.74 kg/m      Reproductive/Obstetrics  Anesthesia Physical  Anesthesia Plan  ASA: 3  Anesthesia Plan: General   Post-op Pain Management:    Induction: Intravenous  PONV Risk Score and Plan: 2 and Propofol infusion and TIVA  Airway Management Planned: Natural Airway  Additional Equipment:   Intra-op Plan:   Post-operative Plan:   Informed Consent: I have reviewed the patients History and Physical, chart, labs and discussed the procedure including the risks, benefits and alternatives for the proposed anesthesia with the patient or authorized representative who has indicated his/her understanding and acceptance.     Dental Advisory Given  Plan Discussed with: CRNA  Anesthesia Plan Comments: (Patient consented for risks of anesthesia including but not limited to:  - adverse reactions to medications - risk of airway placement if required - damage to eyes, teeth, lips or other oral mucosa - nerve damage due to positioning  - sore throat or hoarseness -  Damage to heart, brain, nerves, lungs, other parts of body or loss of life  Patient voiced understanding.)        Anesthesia Quick Evaluation

## 2020-11-03 NOTE — H&P (Signed)
Cephas Darby, MD [address]  [place]  [place], [place] [address]  Main: [phone]  Fax: [phone] Pager: [phone]  Primary Care Physician:  Elgie Collard, MD Primary Gastroenterologist:  Dr. Cephas Darby  Pre-Procedure History & Physical: HPI:  Elizabeth Zhang is a 54 y.o. female is here for an colonoscopy.   Past Medical History:  Diagnosis Date   Asthma    Cesarean delivery delivered [place]   x2   Crohn disease ([place])    1749   Duplicated ureter, left    HGSIL (high grade squamous intraepithelial lesion) on Pap smear of cervix     Past Surgical History:  Procedure Laterality Date   ABDOMINAL HYSTERECTOMY N/A 12/23/2014   Procedure: HYSTERECTOMY ABDOMINAL REPAIR OF COLOTOMY;  Surgeon: Mellody Drown, MD;  Location: [facility];  Service: Gynecology;  Laterality: N/A;   APPENDECTOMY     CESAREAN SECTION N/A [place] RESECTION N/A 04/17/1996   COLONOSCOPY WITH PROPOFOL N/A 11/06/2018   Procedure: COLONOSCOPY WITH PROPOFOL;  Surgeon: Lin Landsman, MD;  Location: [place];  Service: Endoscopy;  Laterality: N/A;   CYSTOSCOPY  12/23/2014   Procedure: CYSTOSCOPY;  Surgeon: Mellody Drown, MD;  Location: [facility];  Service: Gynecology;;   EYE SURGERY     HYSTEROSCOPY WITH D & C N/A 10/07/2014   Procedure: DILATATION AND CURETTAGE /HYSTEROSCOPY;  Surgeon: Mellody Drown, MD;  Location: [facility];  Service: Gynecology;  Laterality: N/A;   LAPAROSCOPIC ASSISTED VAGINAL HYSTERECTOMY N/A 12/23/2014   Procedure: LAPAROSCOPIC ASSISTED VAGINAL HYSTERECTOMY, attempted;  Surgeon: Mellody Drown, MD;  Location: [facility];  Service: Gynecology;  Laterality: N/A;   POLYPECTOMY N/A 11/06/2018   Procedure: POLYPECTOMY;  Surgeon: Lin Landsman, MD;  Location: [place];  Service: Endoscopy;  Laterality: N/A;   URETER SURGERY Left 04/17/1977    Prior to Admission medications   Medication Sig  Start Date End Date Taking? Authorizing Provider  Adalimumab (HUMIRA PEN) 40 MG/0.4ML PNKT INJECT 1 PEN UNDER THE SKIN EVERY 14 DAYS. 10/21/20  Yes Sheyla Zaffino, Tally Due, MD  ALPRAZolam Duanne Moron) 1 MG tablet Take 1 mg by mouth at bedtime as needed for anxiety or sleep. prn 06/17/14  Yes [provider]  cetirizine (ZYRTEC) 10 MG tablet Take 10 mg by mouth daily.   Yes [provider]  Multiple Vitamins-Minerals (HAIR SKIN AND NAILS FORMULA) TABS Take by mouth.   Yes [provider]  omeprazole (PRILOSEC) 20 MG capsule Take by mouth daily. 09/18/19  Yes [provider]  PROAIR HFA 108 (90 BASE) MCG/ACT inhaler Inhale 2 puffs into the lungs every 4 (four) hours as needed for wheezing or shortness of breath. Every 4 to 6 hours prn 09/28/14  Yes [provider]  SYMBICORT 160-4.5 MCG/ACT inhaler Inhale 2 puffs into the lungs 2 (two) times daily. 11/10/19  Yes [provider]  Vitamin D, Ergocalciferol, (DRISDOL) 1.25 MG (50000 UNIT) CAPS capsule Take 1 capsule by mouth once a week. 09/11/20  Yes [provider]  acetaminophen (TYLENOL) 325 MG tablet Take 2 tablets (650 mg total) by mouth every 4 (four) hours. Patient not taking: Reported on 11/03/2020 12/25/14   Ward, Honor Loh, MD  traZODone (DESYREL) 100 MG tablet TAKE 1/2 TABLET EACH NIGHT AT BEDTIME FOR 2 WEEKS, THEN TAKE 1 TABLET EVERY NIGHT THERE AFTER Patient not taking: Reported on 11/03/2020 09/18/19   [provider]    Allergies as  of 09/29/2020 - Review Complete 09/29/2020  Allergen Reaction Noted   Codeine Nausea And Vomiting 12/10/2014   Percocet [oxycodone-acetaminophen] Nausea And Vomiting 12/10/2014   Vicodin [hydrocodone-acetaminophen] Nausea And Vomiting 12/10/2014   Morphine Hives and Nausea And Vomiting 09/16/2014   Nickel Rash 09/16/2014   Penicillin g Rash 09/16/2014    Family History  Problem Relation Age of Onset   Heart attack Father        Had Triple Bypass    Lung disease Mother        Double lung transplant 2011    Social History   Socioeconomic History   Marital status: Divorced    Spouse name: Not on file   Number of children: Not on file   Years of education: Not on file   Highest education level: Not on file  Occupational History   Not on file  Tobacco Use   Smoking status: Former    Packs/day: 0.25    Years: 30.00    Pack years: 7.50    Types: Cigarettes    Quit date: 01/15/2017    Years since quitting: 3.8   Smokeless tobacco: Never   Tobacco comments:    (10/30/18) currently only smokes 2-3 cigs/week  Vaping Use   Vaping Use: Never used  Substance and Sexual Activity   Alcohol use: Yes    Comment: rarely   Drug use: No   Sexual activity: Not on file  Other Topics Concern   Not on file  Social History Narrative   Not on file   Social Determinants of Health   Financial Resource Strain: Not on file  Food Insecurity: Not on file  Transportation Needs: Not on file  Physical Activity: Not on file  Stress: Not on file  Social Connections: Not on file  Intimate Partner Violence: Not on file    Review of Systems: See HPI, otherwise negative ROS  Physical Exam: BP (!) 125/93   Pulse 93   Temp (!) 96.2 F (35.7 C) (Temporal)   Resp 18   Ht 5' 1"  (1.549 m)   Wt 76.2 kg   LMP 12/08/2014   SpO2 99%   BMI 31.74 kg/m  General:   Alert,  pleasant and cooperative in NAD Head:  Normocephalic and atraumatic. Neck:  Supple; no masses or thyromegaly. Lungs:  Clear throughout to auscultation.    Heart:  Regular rate and rhythm. Abdomen:  Soft, nontender and nondistended. Normal bowel sounds, without guarding, and without rebound.   Neurologic:  Alert and  oriented x4;  grossly normal neurologically.  Impression/Plan: Elizabeth Zhang is here for an colonoscopy to be performed for h/o crohn's disease  Risks, benefits, limitations, and alternatives regarding  colonoscopy have been reviewed with the patient.   Questions have been answered.  All parties agreeable.   Jenni Sear, MD  11/03/2020, 8:00 AM

## 2020-11-03 NOTE — Anesthesia Procedure Notes (Signed)
Procedure Name: General with mask airway Date/Time: 11/03/2020 8:10 AM Performed by: Kelton Pillar, CRNA Pre-anesthesia Checklist: Patient identified, Emergency Drugs available, Suction available and Patient being monitored Patient Re-evaluated:Patient Re-evaluated prior to induction Oxygen Delivery Method: Simple face mask Induction Type: IV induction Placement Confirmation: positive ETCO2 and CO2 detector Dental Injury: Teeth and Oropharynx as per pre-operative assessment

## 2020-11-03 NOTE — Telephone Encounter (Signed)
Order the MR Enterography for patient of the abdominal and pelvis. Called Central scheduling and got patient schedule for 11/11/2020 3:[facility]pm [facility]. Patient will need to arrive at 12:30pm. Nothing to eat or drink 4 hours before the scan.  Sent patient a Pharmacist, community message with the time and will call her later

## 2020-11-03 NOTE — Transfer of Care (Addendum)
Immediate Anesthesia Transfer of Care Note  Patient: Elizabeth Zhang  Procedure(s) Performed: COLONOSCOPY WITH PROPOFOL  Patient Location: Endoscopy Unit  Anesthesia Type:General  Level of Consciousness: awake, drowsy and patient cooperative  Airway & Oxygen Therapy: Patient Spontanous Breathing and Patient connected to face mask oxygen  Post-op Assessment: Report given to RN and Post -op Vital signs reviewed and stable  Post vital signs: Reviewed and stable  Last Vitals:  Vitals Value Taken Time  BP 116/80 11/03/20 0842  Temp    Pulse 111 11/03/20 0844  Resp 15 11/03/20 0844  SpO2 98 % 11/03/20 0844  Vitals shown include unvalidated device data.  Last Pain:  Vitals:   11/03/20 0842  TempSrc:   PainSc: 0-No pain         Complications: No notable events documented.

## 2020-11-03 NOTE — Telephone Encounter (Signed)
-----   Message from Lin Landsman, MD sent at 11/03/2020  9:13 AM EDT ----- Ordered fecal calprotectin, please release  Please schedule MR enterography Dx: small bowel crohn's  Call her tomorrow  Thanks  RV

## 2020-11-04 ENCOUNTER — Encounter: Payer: Self-pay | Admitting: Gastroenterology

## 2020-11-11 ENCOUNTER — Other Ambulatory Visit: Payer: Self-pay

## 2020-11-11 ENCOUNTER — Ambulatory Visit
Admission: RE | Admit: 2020-11-11 | Discharge: 2020-11-11 | Disposition: A | Discharge status: Home / Self Care | Payer: 59 | Source: Ambulatory Visit | Attending: Gastroenterology | Admitting: Gastroenterology

## 2020-11-11 DIAGNOSIS — K50012 Crohn's disease of small intestine with intestinal obstruction: Secondary | ICD-10-CM

## 2020-11-11 IMAGING — MR MR [PERSON_NAME] ABD W/CM
22 series · 48 of 48 positions shown · IV contrast (gadavist)
Comparison: [DATE] CT enterography.

CLINICAL DATA: Crohn disease.  History of colonic resection.

EXAM:
MR ABDOMEN AND PELVIS WITHOUT AND WITH CONTRAST (MR ENTEROGRAPHY)
TECHNIQUE: Multiplanar, multisequence MRI of the abdomen and pelvis was
performed both before and during bolus administration of intravenous
contrast. Negative oral contrast VoLumen was given.
CONTRAST:  7.5mL GADAVIST GADOBUTROL 1 MMOL/ML IV SOLN

[Series 4: t2_haste_tra_p2_mbh · axial · 6.0mm · 1.25mm/px · 1 of 38 slices shown (1 of 2)]
[im 1/38]
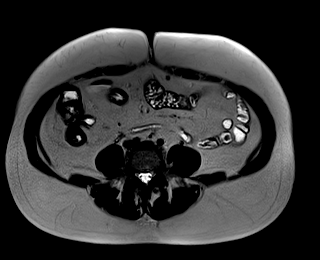

[Series 5: t2_haste_tra_p2_mbh · axial · 6.0mm · 1.25mm/px · 1 of 36 slices shown (2 of 2)]
[im 1/36]
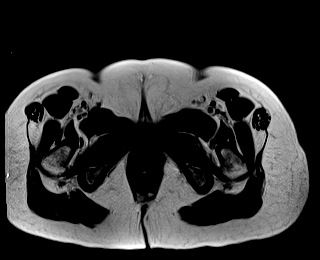

[Series 6: t2_haste_tra_p2_mbh_comp · axial · 6.0mm · 1.25mm/px · 1 of 68 slices shown]
[im 1/68]
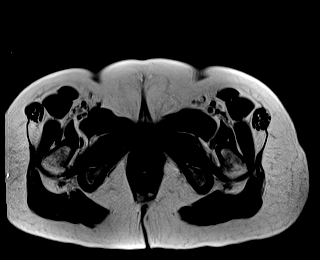

[Series 7: cor haste mbh · coronal · 5.0mm · 0.88mm/px · 1 of 41 slices shown]
[im 1/41]
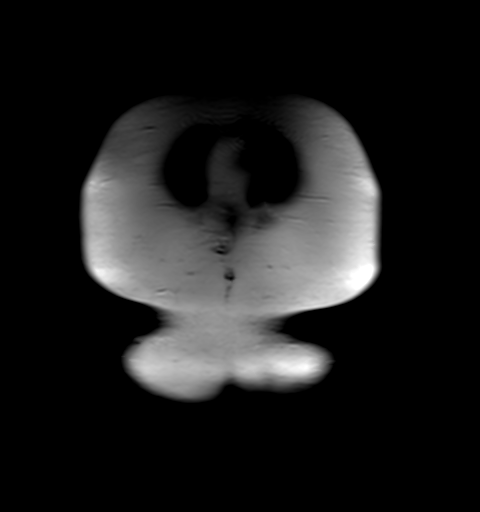

[Series 8: ax dwi_tracew · axial · 6.0mm · 1.49mm/px · z∈[-45,+177]mm · 2 of 114 slices shown (1 of 2)]
[im 1/114]
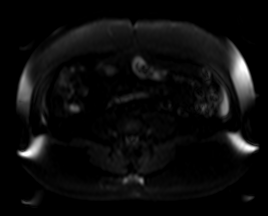
[im 114/114]
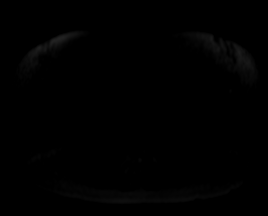

[Series 9: ax dwi_adc · axial · 6.0mm · 1.49mm/px · 1 of 38 slices shown (1 of 2)]
[im 1/38]
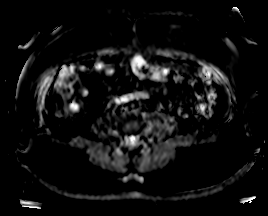

[Series 10: ax dwi_tracew · axial · 6.0mm · 1.49mm/px · z∈[-235,-13]mm · 2 of 114 slices shown (2 of 2)]
[im 1/114]
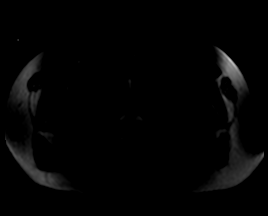
[im 114/114]
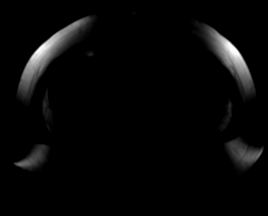

[Series 11: ax dwi_adc · axial · 6.0mm · 1.49mm/px · 1 of 38 slices shown (2 of 2)]
[im 1/38]
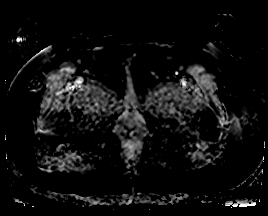

[Series 12: ax dwi_adc_comp · axial · 6.0mm · 1.49mm/px · 1 of 70 slices shown]
[im 1/70]
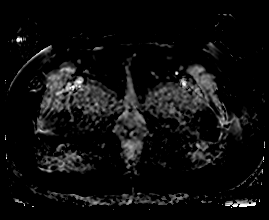

[Series 13: ax dwi_tracew_comp · axial · 6.0mm · 1.49mm/px · z∈[-235,+177]mm · 4 of 210 slices shown]
[im 1/210]
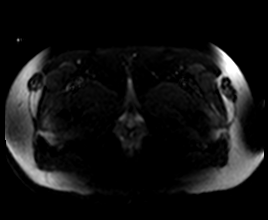
[im 70/210]
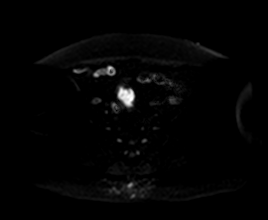
[im 140/210]
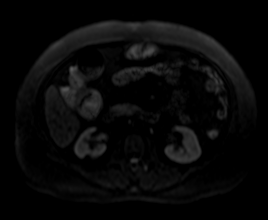
[im 210/210]
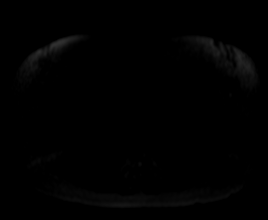

[Series 14: cor true fisp · coronal · 5.0mm · 0.70mm/px · 1 of 41 slices shown]
[im 1/41]
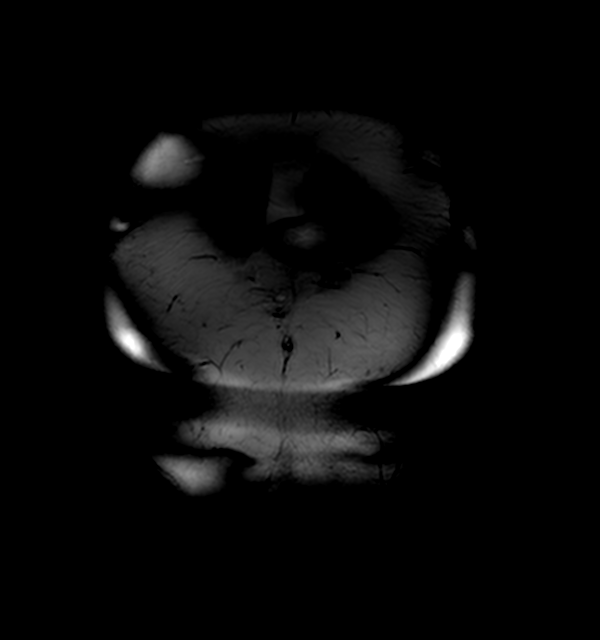

[Series 15: T1 dynamic · axial · 3.0mm · 1.64mm/px · z∈[-217,+140]mm · 2 of 120 slices shown (1 of 11)]
[im 1/120]
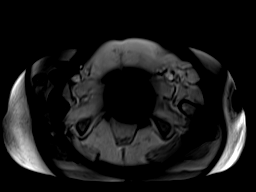
[im 120/120]
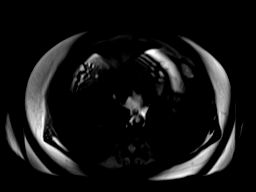

[Series 16: T1 dynamic · axial · 3.0mm · 1.64mm/px · z∈[-217,+140]mm · 3 of 120 slices shown (2 of 11)]
[im 1/120]
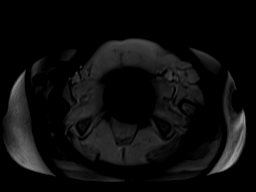
[im 60/120]
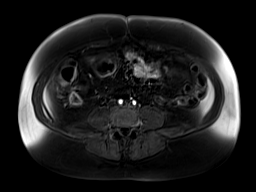
[im 120/120]
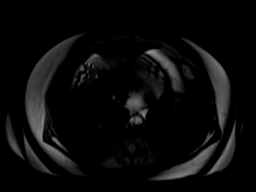

[Series 17: T1 dynamic · axial · 3.0mm · 1.64mm/px · z∈[-217,+140]mm · 3 of 120 slices shown (3 of 11)]
[im 1/120]
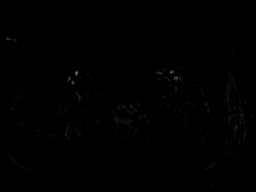
[im 60/120]
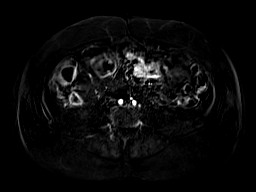
[im 120/120]
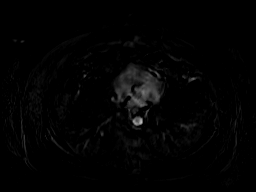

[Series 18: T1 dynamic · axial · 3.0mm · 1.64mm/px · z∈[-217,+140]mm · 3 of 120 slices shown (4 of 11)]
[im 1/120]
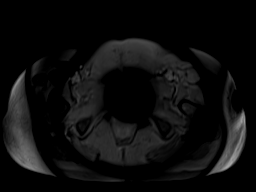
[im 60/120]
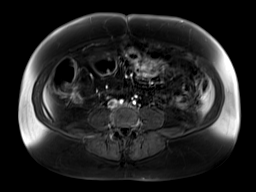
[im 120/120]
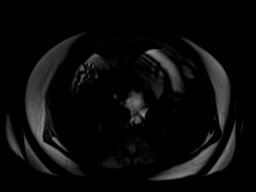

[Series 19: T1 dynamic · axial · 3.0mm · 1.64mm/px · z∈[-217,+140]mm · 3 of 120 slices shown (5 of 11)]
[im 1/120]
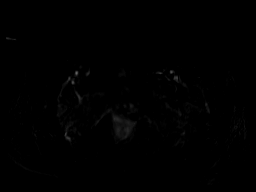
[im 60/120]
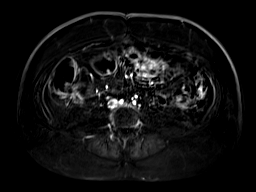
[im 120/120]
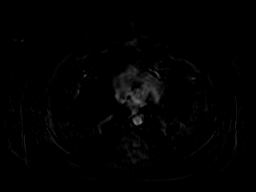

[Series 20: T1 dynamic · axial · 3.0mm · 1.64mm/px · z∈[-217,+140]mm · 3 of 120 slices shown (6 of 11)]
[im 1/120]
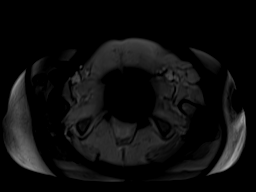
[im 60/120]
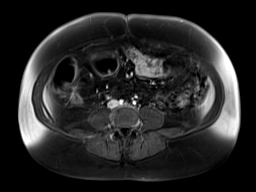
[im 120/120]
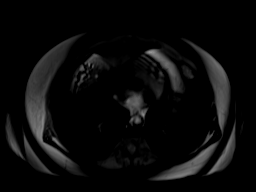

[Series 21: T1 dynamic · axial · 3.0mm · 1.64mm/px · z∈[-217,+140]mm · 3 of 120 slices shown (7 of 11)]
[im 1/120]
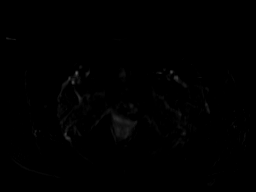
[im 60/120]
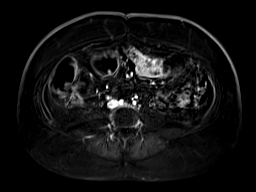
[im 120/120]
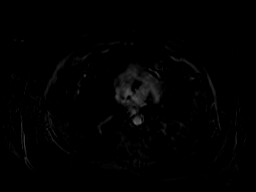

[Series 22: T1 dynamic · coronal · 1.6mm · 1.76mm/px · 3 of 128 slices shown (8 of 11)]
[im 1/128]
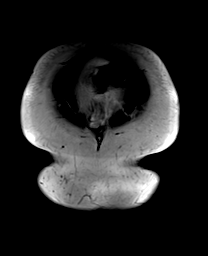
[im 64/128]
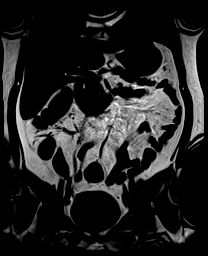
[im 128/128]
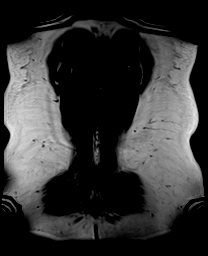

[Series 23: T1 dynamic · coronal · 1.6mm · 1.76mm/px · 3 of 128 slices shown (9 of 11)]
[im 1/128]
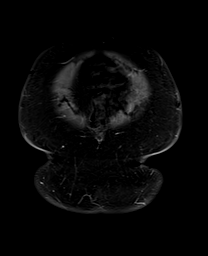
[im 64/128]
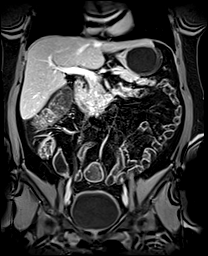
[im 128/128]
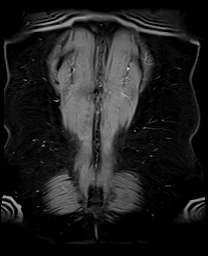

[Series 24: T1 dynamic · axial · 3.0mm · 1.64mm/px · z∈[-217,+140]mm · 3 of 120 slices shown (10 of 11)]
[im 1/120]
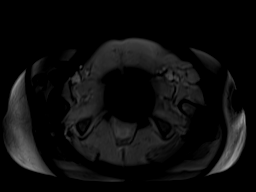
[im 60/120]
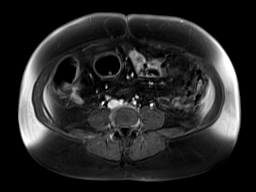
[im 120/120]
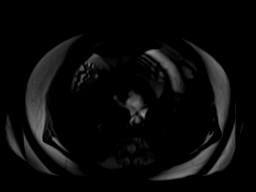

[Series 25: T1 dynamic · axial · 3.0mm · 1.64mm/px · z∈[-217,+140]mm · 3 of 120 slices shown (11 of 11)]
[im 1/120]
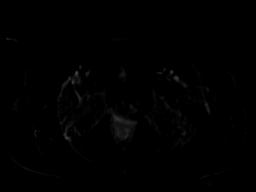
[im 60/120]
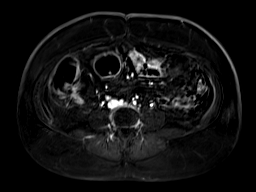
[im 120/120]
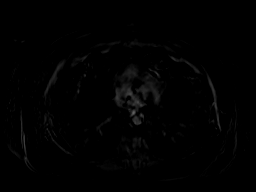

[48 of 48 positions shown; findings below may reference images not displayed]

FINDINGS: COMBINED FINDINGS FOR BOTH MR ABDOMEN AND PELVIS

Lower chest: Unremarkable.

Hepatobiliary: No suspicious focal abnormality within the liver
parenchyma. Gallbladder is surgically absent. No intrahepatic or
extrahepatic biliary dilation.

Pancreas: No focal mass lesion. No dilatation of the main duct. No
intraparenchymal cyst. No peripancreatic edema.

Spleen: Tiny foci of signal void along the splenic capsule or
compatible with the calcification seen on previous CT.

Adrenals/Urinary Tract: No adrenal nodule or mass. Cortical scarring
noted interpolar right kidney kidneys otherwise unremarkable. No
evidence for hydroureter. The urinary bladder appears normal for the
degree of distention.

Stomach/Bowel: Stomach is well distended with contrast material. No
mural thickening or abnormal mural enhancement. Duodenum
unremarkable. Jejunal loops are moderately well distended without
mucosal or transmural hyperenhancement. No abnormal mural
enhancement in the terminal ileum. There are no features to suggest
inflammatory or fibrous small bowel stricture. Similar apparent
narrowing in the cecum in the region of the ileocecal valve, stable
since CT [DATE].Mild wall thickening in the cecum demonstrates
transmural hyperenhancement without substantial pericolonic edema or
inflammation. Patient is status post left hemicolectomy with mild
mucosal hyperenhancement in the distal sigmoid colon and rectum.

Vascular/Lymphatic: No abdominal aortic aneurysm There is no
gastrohepatic or hepatoduodenal ligament lymphadenopathy. No
retroperitoneal or mesenteric lymphadenopathy. No pelvic sidewall
lymphadenopathy.

Reproductive: Uterus surgically absent.  There is no adnexal mass.

Other:  No intraperitoneal free fluid.

Musculoskeletal: No focal suspicious marrow enhancement within the
visualized bony anatomy.
IMPRESSION: 1. No wall thickening in the distal or terminal ileum no substantial
mucosal or transmural hyperenhancement in the distal ileum.
2. No evidence for inflammatory or fibrous small bowel stricture.
3. Similar appearance of the cecum in the region of the ileocecal
valve with mild wall thickening and hyperenhancement. Transmural
hyperenhancement noted in the distal sigmoid colon and rectum. No
appreciable pericolonic edema or inflammation in these regions.
Findings indeterminate but infectious/inflammatory colitis not
excluded.
4. No intraperitoneal free fluid.

## 2020-11-11 IMAGING — MR MR [PERSON_NAME] PELVIS W/CM
22 series · 48 of 48 positions shown · IV contrast (gadavist)
Comparison: [DATE] CT enterography.

CLINICAL DATA: Crohn disease.  History of colonic resection.

EXAM:
MR ABDOMEN AND PELVIS WITHOUT AND WITH CONTRAST (MR ENTEROGRAPHY)
TECHNIQUE: Multiplanar, multisequence MRI of the abdomen and pelvis was
performed both before and during bolus administration of intravenous
contrast. Negative oral contrast VoLumen was given.
CONTRAST:  7.5mL GADAVIST GADOBUTROL 1 MMOL/ML IV SOLN

[Series 4: t2_haste_tra_p2_mbh · axial · 6.0mm · 1.25mm/px · 1 of 38 slices shown (1 of 2)]
[im 1/38]
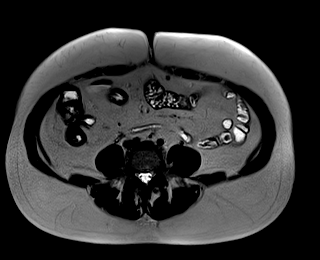

[Series 5: t2_haste_tra_p2_mbh · axial · 6.0mm · 1.25mm/px · 1 of 36 slices shown (2 of 2)]
[im 1/36]
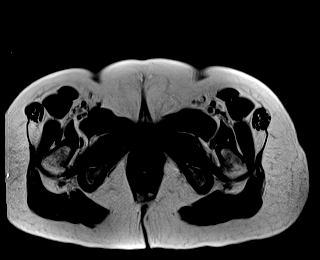

[Series 6: t2_haste_tra_p2_mbh_comp · axial · 6.0mm · 1.25mm/px · 1 of 68 slices shown]
[im 1/68]
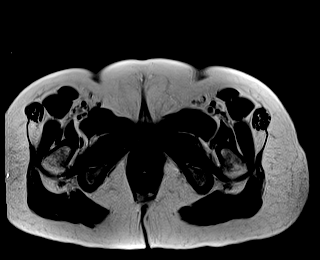

[Series 7: cor haste mbh · coronal · 5.0mm · 0.88mm/px · 1 of 41 slices shown]
[im 1/41]
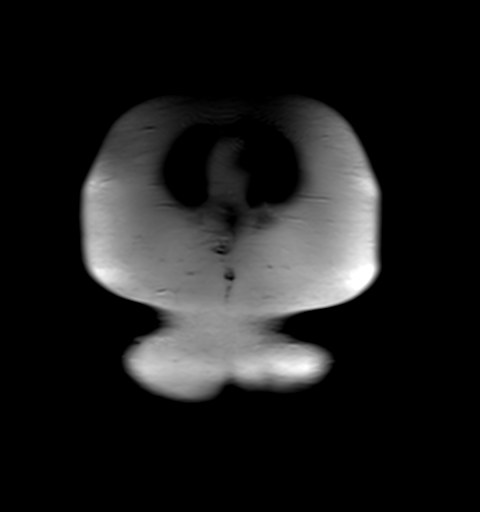

[Series 8: ax dwi_tracew · axial · 6.0mm · 1.49mm/px · z∈[-45,+177]mm · 2 of 114 slices shown (1 of 2)]
[im 1/114]
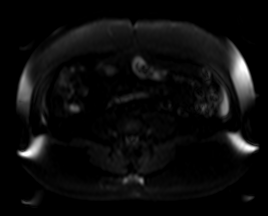
[im 114/114]
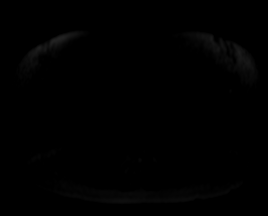

[Series 9: ax dwi_adc · axial · 6.0mm · 1.49mm/px · 1 of 38 slices shown (1 of 2)]
[im 1/38]
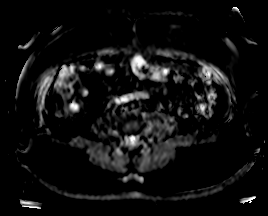

[Series 10: ax dwi_tracew · axial · 6.0mm · 1.49mm/px · z∈[-235,-13]mm · 2 of 114 slices shown (2 of 2)]
[im 1/114]
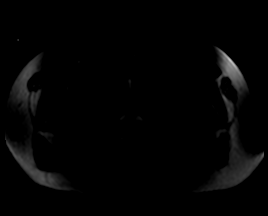
[im 114/114]
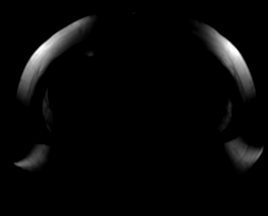

[Series 11: ax dwi_adc · axial · 6.0mm · 1.49mm/px · 1 of 38 slices shown (2 of 2)]
[im 1/38]
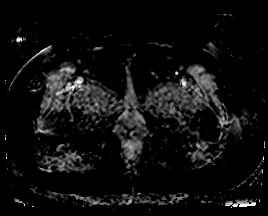

[Series 12: ax dwi_adc_comp · axial · 6.0mm · 1.49mm/px · 1 of 70 slices shown]
[im 1/70]
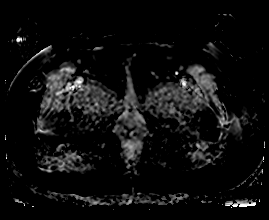

[Series 13: ax dwi_tracew_comp · axial · 6.0mm · 1.49mm/px · z∈[-235,+177]mm · 4 of 210 slices shown]
[im 1/210]
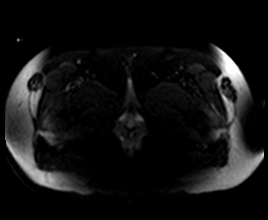
[im 70/210]
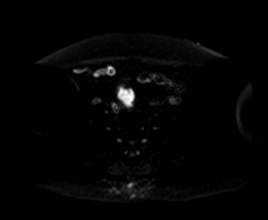
[im 140/210]
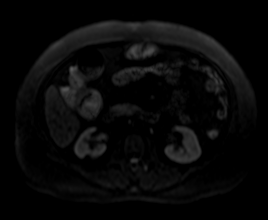
[im 210/210]
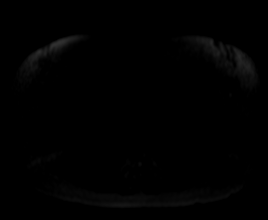

[Series 14: cor true fisp · coronal · 5.0mm · 0.70mm/px · 1 of 41 slices shown]
[im 1/41]
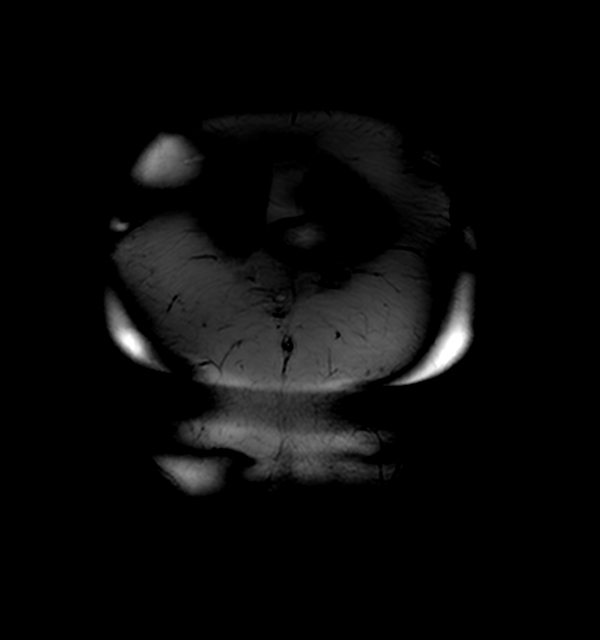

[Series 15: T1 dynamic · axial · 3.0mm · 1.64mm/px · z∈[-217,+140]mm · 2 of 120 slices shown (1 of 11)]
[im 1/120]
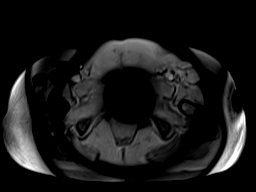
[im 120/120]
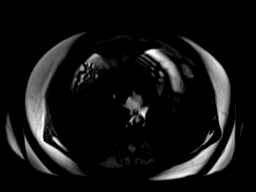

[Series 16: T1 dynamic · axial · 3.0mm · 1.64mm/px · z∈[-217,+140]mm · 3 of 120 slices shown (2 of 11)]
[im 1/120]
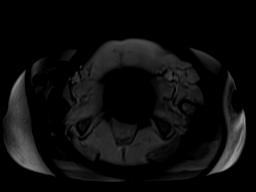
[im 60/120]
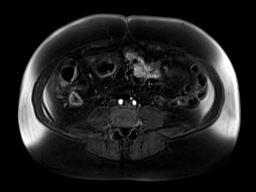
[im 120/120]
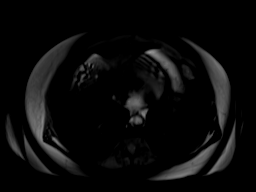

[Series 17: T1 dynamic · axial · 3.0mm · 1.64mm/px · z∈[-217,+140]mm · 3 of 120 slices shown (3 of 11)]
[im 1/120]
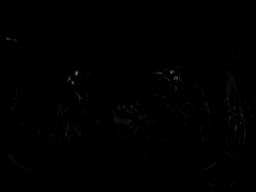
[im 60/120]
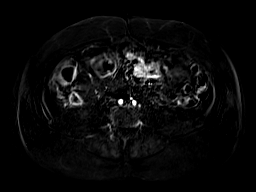
[im 120/120]
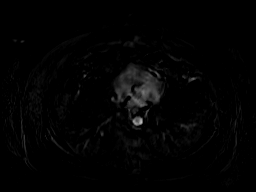

[Series 18: T1 dynamic · axial · 3.0mm · 1.64mm/px · z∈[-217,+140]mm · 3 of 120 slices shown (4 of 11)]
[im 1/120]
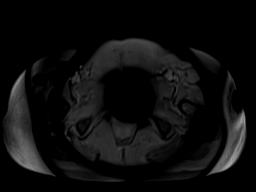
[im 60/120]
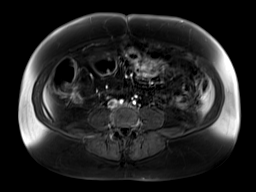
[im 120/120]
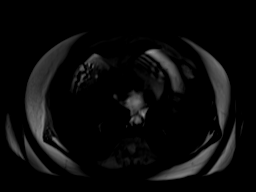

[Series 19: T1 dynamic · axial · 3.0mm · 1.64mm/px · z∈[-217,+140]mm · 3 of 120 slices shown (5 of 11)]
[im 1/120]
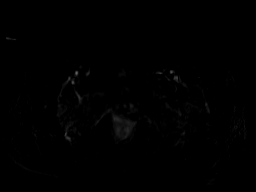
[im 60/120]
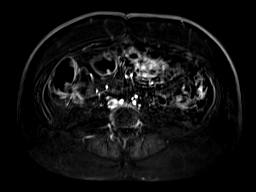
[im 120/120]
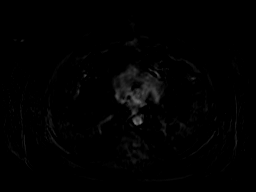

[Series 20: T1 dynamic · axial · 3.0mm · 1.64mm/px · z∈[-217,+140]mm · 3 of 120 slices shown (6 of 11)]
[im 1/120]
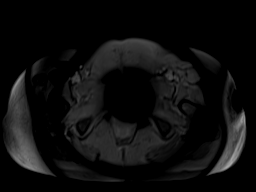
[im 60/120]
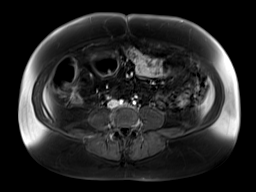
[im 120/120]
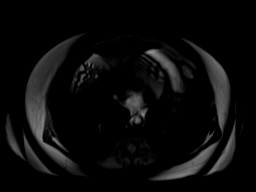

[Series 21: T1 dynamic · axial · 3.0mm · 1.64mm/px · z∈[-217,+140]mm · 3 of 120 slices shown (7 of 11)]
[im 1/120]
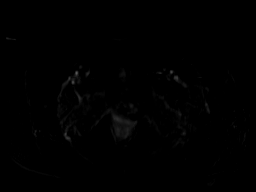
[im 60/120]
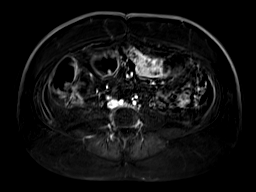
[im 120/120]
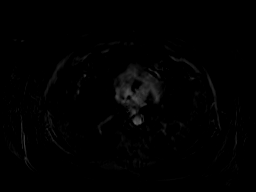

[Series 22: T1 dynamic · coronal · 1.6mm · 1.76mm/px · 3 of 128 slices shown (8 of 11)]
[im 1/128]
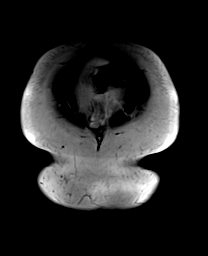
[im 64/128]
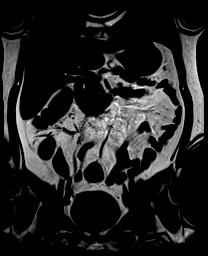
[im 128/128]
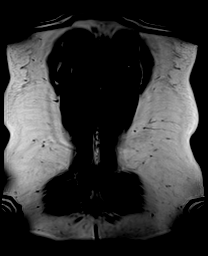

[Series 23: T1 dynamic · coronal · 1.6mm · 1.76mm/px · 3 of 128 slices shown (9 of 11)]
[im 1/128]
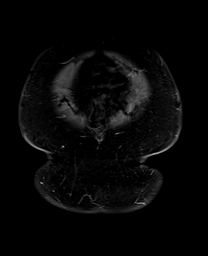
[im 64/128]
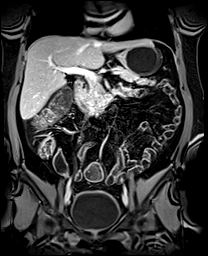
[im 128/128]
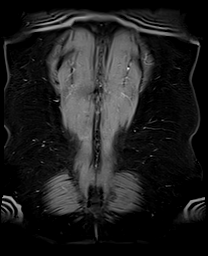

[Series 24: T1 dynamic · axial · 3.0mm · 1.64mm/px · z∈[-217,+140]mm · 3 of 120 slices shown (10 of 11)]
[im 1/120]
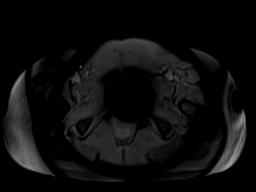
[im 60/120]
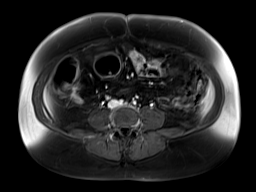
[im 120/120]
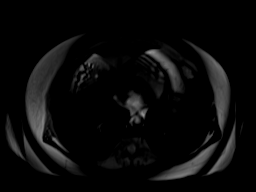

[Series 25: T1 dynamic · axial · 3.0mm · 1.64mm/px · z∈[-217,+140]mm · 3 of 120 slices shown (11 of 11)]
[im 1/120]
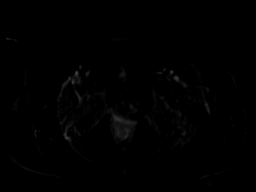
[im 60/120]
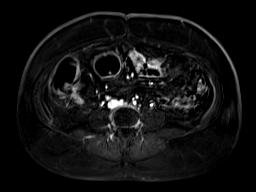
[im 120/120]
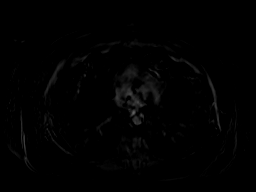

[48 of 48 positions shown; findings below may reference images not displayed]

FINDINGS: COMBINED FINDINGS FOR BOTH MR ABDOMEN AND PELVIS

Lower chest: Unremarkable.

Hepatobiliary: No suspicious focal abnormality within the liver
parenchyma. Gallbladder is surgically absent. No intrahepatic or
extrahepatic biliary dilation.

Pancreas: No focal mass lesion. No dilatation of the main duct. No
intraparenchymal cyst. No peripancreatic edema.

Spleen: Tiny foci of signal void along the splenic capsule or
compatible with the calcification seen on previous CT.

Adrenals/Urinary Tract: No adrenal nodule or mass. Cortical scarring
noted interpolar right kidney kidneys otherwise unremarkable. No
evidence for hydroureter. The urinary bladder appears normal for the
degree of distention.

Stomach/Bowel: Stomach is well distended with contrast material. No
mural thickening or abnormal mural enhancement. Duodenum
unremarkable. Jejunal loops are moderately well distended without
mucosal or transmural hyperenhancement. No abnormal mural
enhancement in the terminal ileum. There are no features to suggest
inflammatory or fibrous small bowel stricture. Similar apparent
narrowing in the cecum in the region of the ileocecal valve, stable
since CT [DATE].Mild wall thickening in the cecum demonstrates
transmural hyperenhancement without substantial pericolonic edema or
inflammation. Patient is status post left hemicolectomy with mild
mucosal hyperenhancement in the distal sigmoid colon and rectum.

Vascular/Lymphatic: No abdominal aortic aneurysm There is no
gastrohepatic or hepatoduodenal ligament lymphadenopathy. No
retroperitoneal or mesenteric lymphadenopathy. No pelvic sidewall
lymphadenopathy.

Reproductive: Uterus surgically absent.  There is no adnexal mass.

Other:  No intraperitoneal free fluid.

Musculoskeletal: No focal suspicious marrow enhancement within the
visualized bony anatomy.
IMPRESSION: 1. No wall thickening in the distal or terminal ileum no substantial
mucosal or transmural hyperenhancement in the distal ileum.
2. No evidence for inflammatory or fibrous small bowel stricture.
3. Similar appearance of the cecum in the region of the ileocecal
valve with mild wall thickening and hyperenhancement. Transmural
hyperenhancement noted in the distal sigmoid colon and rectum. No
appreciable pericolonic edema or inflammation in these regions.
Findings indeterminate but infectious/inflammatory colitis not
excluded.
4. No intraperitoneal free fluid.

## 2020-11-11 MED ORDER — GADOBUTROL 1 MMOL/ML IV SOLN
7.5000 mL | Freq: Once | INTRAVENOUS | Status: AC | PRN
  Administered 2020-11-11: 7.5 mL via INTRAVENOUS

## 2020-11-23 ENCOUNTER — Encounter: Payer: Self-pay | Admitting: Gastroenterology

## 2020-12-14 ENCOUNTER — Telehealth: Payer: Self-pay

## 2020-12-14 ENCOUNTER — Other Ambulatory Visit: Payer: Self-pay

## 2020-12-14 DIAGNOSIS — K50012 Crohn's disease of small intestine with intestinal obstruction: Secondary | ICD-10-CM

## 2020-12-14 NOTE — Telephone Encounter (Signed)
Patient did not come and get a stool kit to collect the Stool sample. She brought the sample in a container she had at home. Kathy informed patient to let her look at it before she left and she said she did not have time and left. When kathy got it back to the lab she realize it was not in a correct container. Called patient and informed patient that she had to come pick up the stool kit and collect the sample in that stool kit at home. We can not take it any other way. She verbalized understanding  

## 2021-02-17 ENCOUNTER — Encounter: Payer: Self-pay | Admitting: Gastroenterology

## 2021-02-17 LAB — CALPROTECTIN, FECAL: Calprotectin, Fecal: 44 ug/g (ref 0–120)

## 2021-04-05 ENCOUNTER — Ambulatory Visit: Payer: 59 | Admitting: Gastroenterology

## 2021-04-06 ENCOUNTER — Ambulatory Visit: Payer: 59 | Admitting: Gastroenterology

## 2021-06-16 ENCOUNTER — Ambulatory Visit: Payer: 59 | Admitting: Dermatology

## 2021-07-25 ENCOUNTER — Other Ambulatory Visit: Payer: Self-pay | Admitting: Gastroenterology

## 2021-10-03 ENCOUNTER — Ambulatory Visit: Payer: 59 | Admitting: Gastroenterology

## 2021-10-03 ENCOUNTER — Encounter: Payer: Self-pay | Admitting: Gastroenterology

## 2021-10-03 VITALS — BP 129/72 | HR 83 | Temp 98.4°F | Ht 61.0 in | Wt 147.0 lb

## 2021-10-03 DIAGNOSIS — K50012 Crohn's disease of small intestine with intestinal obstruction: Secondary | ICD-10-CM | POA: Diagnosis not present

## 2021-10-03 MED ORDER — SHINGRIX 50 MCG/0.5ML IM SUSR: 0.5000 mL | Freq: Once | INTRAMUSCULAR | 0 refills | Status: AC

## 2021-10-03 NOTE — Progress Notes (Signed)
Elizabeth Darby, MD [address]  [place]  [place], [place] [address]  Main: [phone]  Fax: [phone]    Gastroenterology Consultation  Referring Provider:     Elgie Collard, MD Primary Care Physician:  Elgie Collard, MD Primary Gastroenterologist:  Dr. Cephas Zhang Reason for Consultation:     Small bowel Crohn's, postop recurrence        HPI:   Elizabeth Zhang is a 56 y.o. female referred by Dr. Elgie Collard, MD  for consultation & management of postop recurrence of small bowel Crohn's  Patient initially saw me for screening colonoscopy on 11/06/2018.  She was found to have ileocolonic anastomosis with anastomotic stricture and unable to traverse.  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  Subsequently, she underwent CT enterography which revealed thickening of the neoterminal ileum up to 5 cm in length.  Patient has history of Crohn's disease at age 66, has been maintained on steroids for about 12 years and mesalamine derivatives.  She underwent ileocolonic resection in 1998 secondary to small bowel obstruction.  Since then, patient has not been on any maintenance therapy for Crohn's.  However, over the last 3 months she has been experiencing central abdominal pain associated with abdominal bloating, several episodes of dark bowel movements.  She reports anywhere from 10 to 12/day.  She reports feeling tired.  She also reports large joint arthralgias.  She lost about 10 to 12 pounds in last 3 months.  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  Patient had 2 pregnancies several years ago which were uneventful without any exacerbation of Crohn's.  She also had rectosigmoid surgery when she had hysterectomy complicated by perforation. The latest labs from 2016 revealed normal hemoglobin, mild leukocytosis  Follow-up visit 10/03/2021 Patient is doing well from a Crohn's standpoint.  She  lost about 30 pounds intentionally by following low carbohydrate diet.  She does not have any concerns today.  She does smoke cigarettes.  She had labs done on 09/17/2021 by her PCP, reviewed on her phone, normal CBC, CMP, vitamin D levels   Crohn's disease classification:  Age: < 17  Location: Ileal Behavior: stricturing  Perianal:  no  IBD diagnosis: At age 43  Disease course:Patient has history of Crohn's disease at age 38, has been maintained on steroids for about 12 years and mesalamine derivatives.  She underwent ileocolonic resection in 1998 secondary to small bowel obstruction.  Since then, patient has not been on any maintenance therapy for Crohn's.  However, over the last 3 months she has been experiencing central abdominal pain associated with abdominal bloating, several episodes of dark bowel movements.  She reports anywhere from 10 to 12/day.  She reports feeling tired.  She also reports large joint arthralgias.  She lost about 10 to 12 pounds in last 3 months.  She is an Art gallery manager for [employer].  Patient had 2 pregnancies several years ago which were uneventful without any exacerbation of Crohn's.  She also had rectosigmoid surgery when she had hysterectomy complicated by perforation.  Maintained on Humira weekly.  Switched to Humira biweekly in November 2021  Extra intestinal manifestations: Large joint arthralgias  IBD surgical history: Ileocecal resection secondary to small bowel obstruction in 1998  Imaging:  MRE none CTE 12/04/2018 IMPRESSION: 1. Wall thickening in the distal 5 cm of the ileum and in the proximal colon suggesting mild to moderate enterocolitis.  This could well be a manifestation of Crohn's disease. No abscess or extraluminal gas. 2. Partial colectomy with anastomotic staple line in the upper pelvis. 3. Otherwise normal appearance of the small bowel.  SBFT 05/03/2011 Findings raising concern of decreased peristalsis within  the distal and  terminal ileum without further abnormalities.  Procedures: Colonoscopy 10/03/2021 - Unable to locate the opening of neoterminal ileum end-to-side ileo-colonic anastomosis, characterized by unknown. - The entire examined colon is normal. - The distal rectum and anal verge are normal on retroflexion view. - Patent functional end-to-end colo-colonic anastomosis, characterized by healthy appearing mucosa. - No specimens collected.   Colonoscopy  11/06/2018 - Perianal skin tags found on perianal exam. - End-to-side ileo-colonic anastomosis, characterized by healthy appearing mucosa, unable to traverse the anastomosis. - Three 5 to 7 mm polyps in the ascending colon, removed with a hot snare. Resected and retrieved. - Patent functional end-to-end colo-rectal anastomosis, characterized by healthy appearing mucosa. - The distal rectum and anal verge are normal on retroflexion view.   Upper Endoscopy none  VCE none  IBD medications:  Steroids: Prednisone yes, budesonide yes 5-ASA: Previously on mesalamine Immunomodulators: AZA, methotrexate : None TPMT status unknown Biologics: Anti TNFs: Humira biweekly started since November 2020 Anti Integrins: Ustekinumab: Tofactinib: Clinical trial:   Past Medical History:  Diagnosis Date   Asthma    Cesarean delivery delivered Elizabeth Zhang   x2   Crohn disease ([place])    6226   Duplicated ureter, left    HGSIL (high grade squamous intraepithelial lesion) on Pap smear of cervix     Past Surgical History:  Procedure Laterality Date   ABDOMINAL HYSTERECTOMY N/A 12/23/2014   Procedure: HYSTERECTOMY ABDOMINAL REPAIR OF COLOTOMY;  Surgeon: Mellody Drown, MD;  Location: [facility];  Service: Gynecology;  Laterality: N/A;   APPENDECTOMY     CESAREAN SECTION N/A [place] RESECTION N/A 04/17/1996   COLONOSCOPY WITH PROPOFOL N/A 11/06/2018   Procedure: COLONOSCOPY WITH PROPOFOL;  Surgeon: Lin Landsman, MD;   Location: [facility];  Service: Endoscopy;  Laterality: N/A;   COLONOSCOPY WITH PROPOFOL N/A 11/03/2020   Procedure: COLONOSCOPY WITH PROPOFOL;  Surgeon: Lin Landsman, MD;  Location: [facility];  Service: Gastroenterology;  Laterality: N/A;   CYSTOSCOPY  12/23/2014   Procedure: CYSTOSCOPY;  Surgeon: Mellody Drown, MD;  Location: [facility];  Service: Gynecology;;   EYE SURGERY     HYSTEROSCOPY WITH D & C N/A 10/07/2014   Procedure: DILATATION AND CURETTAGE /HYSTEROSCOPY;  Surgeon: Mellody Drown, MD;  Location: [facility];  Service: Gynecology;  Laterality: N/A;   LAPAROSCOPIC ASSISTED VAGINAL HYSTERECTOMY N/A 12/23/2014   Procedure: LAPAROSCOPIC ASSISTED VAGINAL HYSTERECTOMY, attempted;  Surgeon: Mellody Drown, MD;  Location: [facility];  Service: Gynecology;  Laterality: N/A;   POLYPECTOMY N/A 11/06/2018   Procedure: POLYPECTOMY;  Surgeon: Lin Landsman, MD;  Location: [place];  Service: Endoscopy;  Laterality: N/A;   URETER SURGERY Left 04/17/1977   Current Outpatient Medications:    Adalimumab (HUMIRA PEN) 40 MG/0.4ML PNKT, INJECT 1 PEN UNDER THE SKIN EVERY 14 DAYS., Disp: 2 each, Rfl: 2   ALPRAZolam (XANAX) 1 MG tablet, Take 1 mg by mouth at bedtime as needed for anxiety or sleep. prn, Disp: , Rfl:    cetirizine (ZYRTEC) 10 MG tablet, Take 10 mg by mouth daily., Disp: , Rfl:    citalopram (CELEXA) 10 MG tablet, Take 20 mg by mouth every morning., Disp: ,  Rfl:    Multiple Vitamins-Minerals (HAIR SKIN AND NAILS FORMULA) TABS, Take by mouth., Disp: , Rfl:    omeprazole (PRILOSEC) 20 MG capsule, Take by mouth daily., Disp: , Rfl:    PROAIR HFA 108 (90 BASE) MCG/ACT inhaler, Inhale 2 puffs into the lungs every 4 (four) hours as needed for wheezing or shortness of breath. Every 4 to 6 hours prn, Disp: , Rfl: 3   SYMBICORT 160-4.5 MCG/ACT inhaler, Inhale 2 puffs into the lungs 2 (two) times daily., Disp: , Rfl:    traZODone (DESYREL) 100 MG tablet, Take 100  mg by mouth at bedtime., Disp: , Rfl:    Vitamin D, Ergocalciferol, (DRISDOL) 1.25 MG (50000 UNIT) CAPS capsule, Take 1 capsule by mouth once a week., Disp: , Rfl:    Zoster Vaccine Adjuvanted (SHINGRIX) injection, Inject 0.5 mLs into the muscle once for 1 dose., Disp: 1 each, Rfl: 0   Family History  Problem Relation Age of Onset   Heart attack Father        Had Triple Bypass   Lung disease Mother        Double lung transplant 2011     Social History   Tobacco Use   Smoking status: Former    Packs/day: 0.25    Years: 30.00    Total pack years: 7.50    Types: Cigarettes    Quit date: 01/15/2017    Years since quitting: 4.7   Smokeless tobacco: Never   Tobacco comments:    (10/30/18) currently only smokes 2-3 cigs/week  Vaping Use   Vaping Use: Never used  Substance Use Topics   Alcohol use: Yes    Comment: rarely   Drug use: No    Allergies as of 10/03/2021 - Review Complete 10/03/2021  Allergen Reaction Noted   Codeine Nausea And Vomiting 12/10/2014   Percocet [oxycodone-acetaminophen] Nausea And Vomiting 12/10/2014   Vicodin [hydrocodone-acetaminophen] Nausea And Vomiting 12/10/2014   Morphine Hives and Nausea And Vomiting 09/16/2014   Nickel Rash 09/16/2014   Penicillin g Rash 09/16/2014    Review of Systems:    All systems reviewed and negative except where noted in HPI.   Physical Exam:  BP 129/72 (BP Location: Left Arm, Patient Position: Sitting, Cuff Size: Normal)   Pulse 83   Temp 98.4 F (36.9 C) (Oral)   Ht 5' 1"  (1.549 m)   Wt 147 lb (66.7 kg)   LMP 12/08/2014   BMI 27.78 kg/m  Patient's last menstrual period was 12/08/2014.  General:   Alert,  Well-developed, well-nourished, pleasant and cooperative in NAD Head:  Normocephalic and atraumatic. Eyes:  Sclera clear, no icterus.   Conjunctiva pink. Ears:  Normal auditory acuity. Nose:  No deformity, discharge, or lesions. Mouth:  No deformity or lesions,oropharynx pink & moist. Neck:  Supple; no  masses or thyromegaly. Lungs:  Respirations even and unlabored.  Clear throughout to auscultation.   No wheezes, crackles, or rhonchi. No acute distress. Heart:  Regular rate and rhythm; no murmurs, clicks, rubs, or gallops. Abdomen:  Normal bowel sounds. Soft, obese, non-tender and nondistended without masses, hepatosplenomegaly or hernias noted.  No guarding or rebound tenderness.   Rectal: Not performed Msk:  Symmetrical without gross deformities. Good, equal movement & strength bilaterally. Pulses:  Normal pulses noted. Extremities:  No clubbing or edema.  No cyanosis. Neurologic:  Alert and oriented x3;  grossly normal neurologically. Skin:  Intact without significant lesions or rashes. No jaundice. Psych:  Alert and cooperative. Normal mood and  affect.  Imaging Studies: Reviewed  Assessment and Plan:   FANTASIA JINKINS is a 56 y.o. female with history of small bowel Crohn's status post ileocecal resection is seen for follow-up of postop recurrence of small bowel Crohn's  Crohn's disease: Currently in clinical and endoscopic remission Last colonoscopy in 10/2020 with no obvious evidence of postop recurrence, however ileocolonic anastomosis could not be traversed MR enterography in 11/13/2020 did not reveal any evidence of active ileitis.  There is no evidence of inflammatory or fibrous small bowel stricture CRP is normal Normal fecal calprotectin levels Patient was on weekly Humira, switched back to biweekly Humira since weekly Humira has been denied by her insurance despite several appeals, no evidence of adalimumab antibodies, trough levels in therapeutic range Check CBC and CMP every 6 months Patient was empirically treated with 2 weeks course of rifaximin for abdominal bloating from bacterial overgrowth  IBD Health Maintenance  1.TB status: QuantiFERON gold negative 2. Anemia: She had polycythemia and elevated ferritin levels.  Family history of polycythemia.  Patient is  evaluated by hematology, no evidence of polycythemia vera 3.Immunizations: Twinrix vaccine, first dose 01/22/2019, second dose 02/2019, third dose 06/2019, annual influenza vaccine, prevnar on 01/22/2019, pneumovax 06/2019, Varicella unknown, Shingrix received first dose on 06/14/2018, recommend booster, prescription given 4.Cancer screening I) Colon cancer/dysplasia surveillance: Up-to-date  II) Cervical cancer: Annual Pap smear up-to-date, normal III) Skin cancer - counseled about annual skin exam by dermatology and skin protection in summer using sun screen SPF > 50, clothing 5.Bone health Vitamin D status: Normal Bone density testing: DEXA normal per patient 5. Labs: Ordered 6. Smoking: Never smoked 7. NSAIDs and Antibiotics use: None   Follow up every 6 months  Elizabeth Darby, MD

## 2021-10-19 ENCOUNTER — Other Ambulatory Visit: Payer: Self-pay | Admitting: Gastroenterology

## 2021-11-01 ENCOUNTER — Other Ambulatory Visit: Payer: Self-pay | Admitting: Gastroenterology

## 2022-03-15 ENCOUNTER — Telehealth: Payer: Self-pay

## 2022-03-15 NOTE — Telephone Encounter (Signed)
Insurance company approved medication till 03/16/23

## 2022-03-15 NOTE — Telephone Encounter (Signed)
Submitted PA by fax to [facility] for patient Humira pen.

## 2022-04-04 ENCOUNTER — Ambulatory Visit: Payer: 59 | Admitting: Gastroenterology

## 2022-04-04 ENCOUNTER — Other Ambulatory Visit: Payer: Self-pay

## 2022-05-26 ENCOUNTER — Telehealth: Payer: Self-pay

## 2022-05-26 ENCOUNTER — Other Ambulatory Visit: Payer: Self-pay | Admitting: Gastroenterology

## 2022-05-26 MED ORDER — ADALIMUMAB-ADAZ 40 MG/0.4ML ~~LOC~~ SOSY: 1.0000 | PREFILLED_SYRINGE | SUBCUTANEOUS | 1 refills | Status: DC

## 2022-05-26 NOTE — Telephone Encounter (Signed)
Sent changed in medication to the pharmacy

## 2022-05-26 NOTE — Addendum Note (Signed)
Addended by: Ulyess Blossom L on: 05/26/2022 09:57 AM   Modules accepted: Orders

## 2022-05-26 NOTE — Telephone Encounter (Signed)
Agree, please do  RV

## 2022-05-26 NOTE — Telephone Encounter (Signed)
Insurance company will no longer cover brand name Humira starting 04/01. They want to know if we can change her to [facility]

## 2022-06-15 ENCOUNTER — Telehealth: Payer: Self-pay | Admitting: Gastroenterology

## 2022-06-15 NOTE — Telephone Encounter (Signed)
Patient calling stating that her Elizabeth Zhang is no longer covered and is wondering what she needs to do. Requesting call back.

## 2022-06-15 NOTE — Telephone Encounter (Signed)
I have already talk to patient this afternoon and informed her we have changed the medication due to insurance and sent it to [facility]. Also made follow up appointment

## 2022-06-21 ENCOUNTER — Other Ambulatory Visit: Payer: Self-pay | Admitting: Gastroenterology

## 2022-07-13 ENCOUNTER — Telehealth: Payer: Self-pay

## 2022-07-13 ENCOUNTER — Other Ambulatory Visit: Payer: Self-pay | Admitting: Gastroenterology

## 2022-07-13 NOTE — Telephone Encounter (Signed)
[facility] is calling about patient patient refill on the Humira. [facility]formed [facility] that we called in a bio similar and it was called in on 05/29/2022. She states yes they do have that medication and it will ship to the patient on 07/28/2022

## 2022-08-09 ENCOUNTER — Encounter: Payer: Self-pay | Admitting: Gastroenterology

## 2022-08-09 ENCOUNTER — Ambulatory Visit: Payer: 59 | Admitting: Gastroenterology

## 2022-08-09 ENCOUNTER — Other Ambulatory Visit: Payer: Self-pay

## 2022-08-09 VITALS — BP 122/80 | HR 90 | Temp 98.3°F | Ht 61.0 in | Wt 136.1 lb

## 2022-08-09 DIAGNOSIS — K50012 Crohn's disease of small intestine with intestinal obstruction: Secondary | ICD-10-CM | POA: Diagnosis not present

## 2022-08-09 NOTE — Progress Notes (Signed)
[name], MD [address]  [address]  [place], [place] [address]  Main: [phone]  Fax: [phone]    Gastroenterology Consultation  Referring Provider:     Cassell Clement, MD Primary Care Physician:  Cassell Clement, MD Primary Gastroenterologist:  Dr. [name] Reason for Consultation:     Small bowel Crohn's, postop recurrence        HPI:   Elizabeth Zhang is a 57 y.o. female referred by Dr. Cassell Clement, MD  for consultation & management of postop recurrence of small bowel Crohn's  Patient initially saw me for screening colonoscopy on 11/06/2018.  She was found to have ileocolonic anastomosis with anastomotic stricture and unable to traverse.  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  Subsequently, she underwent CT enterography which revealed thickening of the neoterminal ileum up to 5 cm in length.  Patient has history of Crohn's disease at age 89, has been maintained on steroids for about 12 years and mesalamine derivatives.  She underwent ileocolonic resection in 1998 secondary to small bowel obstruction.  Since then, patient has not been on any maintenance therapy for Crohn's.  However, over the last 3 months she has been experiencing central abdominal pain associated with abdominal bloating, several episodes of dark bowel movements.  She reports anywhere from 10 to 12/day.  She reports feeling tired.  She also reports large joint arthralgias.  She lost about 10 to 12 pounds in last 3 months.  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  Patient had 2 pregnancies several years ago which were uneventful without any exacerbation of Crohn's.  She also had rectosigmoid surgery when she had hysterectomy complicated by perforation. The latest labs from 2016 revealed normal hemoglobin, mild leukocytosis  Follow-up visit 10/03/2021 Patient is doing well from a Crohn's standpoint.  She  lost about 30 pounds intentionally by following low carbohydrate diet.  She does not have any concerns today.  She does smoke cigarettes.  She had labs done on 09/17/2021 by her PCP, reviewed on her phone, normal CBC, CMP, vitamin D levels  Follow-up visit 08/09/22 Elizabeth Zhang is here for follow-up of Crohn's disease.  She lost about 10 more pounds since last visit, continues to follow clean diet, eliminated all processed foods, pasta, bread, red meat, rice.  She does not have any concerns today.  She stopped smoking.  She continues to take Humira biosimilar every other week.  Does not have any GI concerns.  She also feels that her GI system is working well since she has eliminated processed foods  Crohn's disease classification:  Age: < 17  Location: Ileal Behavior: stricturing  Perianal:  no  IBD diagnosis: At age 69  Disease course:Patient has history of Crohn's disease at age 63, has been maintained on steroids for about 12 years and mesalamine derivatives.  She underwent ileocolonic resection in 1998 secondary to small bowel obstruction.  Since then, patient has not been on any maintenance therapy for Crohn's.  However, over the last 3 months she has been experiencing central abdominal pain associated with abdominal bloating, several episodes of dark bowel movements.  She reports anywhere from 10 to 12/day.  She reports feeling tired.  She also reports large joint arthralgias.  She lost about 10 to 12 pounds in last 3 months.  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  Patient had 2 pregnancies several years ago which  were uneventful without any exacerbation of Crohn's.  She also had rectosigmoid surgery when she had hysterectomy complicated by perforation.  Maintained on Humira weekly.  Switched to Humira biweekly in November 2021.  On Humira bio similar since 05/2022  Extra intestinal manifestations: Large joint arthralgias  IBD surgical history: Ileocecal resection secondary to small  bowel obstruction in 1998  Imaging:  MRE none CTE 12/04/2018 IMPRESSION: 1. Wall thickening in the distal 5 cm of the ileum and in the proximal colon suggesting mild to moderate enterocolitis. This could well be a manifestation of Crohn's disease. No abscess or extraluminal gas. 2. Partial colectomy with anastomotic staple line in the upper pelvis. 3. Otherwise normal appearance of the small bowel.  SBFT 05/03/2011 Findings raising concern of decreased peristalsis within  the distal and terminal ileum without further abnormalities.  Procedures: Colonoscopy 10/03/2021 - Unable to locate the opening of neoterminal ileum end-to-side ileo-colonic anastomosis, characterized by unknown. - The entire examined colon is normal. - The distal rectum and anal verge are normal on retroflexion view. - Patent functional end-to-end colo-colonic anastomosis, characterized by healthy appearing mucosa. - No specimens collected.   Colonoscopy  11/06/2018 - Perianal skin tags found on perianal exam. - End-to-side ileo-colonic anastomosis, characterized by healthy appearing mucosa, unable to traverse the anastomosis. - Three 5 to 7 mm polyps in the ascending colon, removed with a hot snare. Resected and retrieved. - Patent functional end-to-end colo-rectal anastomosis, characterized by healthy appearing mucosa. - The distal rectum and anal verge are normal on retroflexion view.   Upper Endoscopy none  VCE none  IBD medications:  Steroids: Prednisone yes, budesonide yes 5-ASA: Previously on mesalamine Immunomodulators: AZA, methotrexate : None TPMT status unknown Biologics: Anti TNFs: Humira biweekly started since November 2020 Anti Integrins: Ustekinumab: Tofactinib: Clinical trial:   Past Medical History:  Diagnosis Date   Asthma    Cesarean delivery delivered 1993 & 1998   x2   Crohn disease    1980   Duplicated ureter, left    HGSIL (high grade squamous intraepithelial  lesion) on Pap smear of cervix     Past Surgical History:  Procedure Laterality Date   ABDOMINAL HYSTERECTOMY N/A 12/23/2014   Procedure: HYSTERECTOMY ABDOMINAL REPAIR OF COLOTOMY;  Surgeon: Leida Lauth, MD;  Location: [facility];  Service: Gynecology;  Laterality: N/A;   APPENDECTOMY     CESAREAN SECTION N/A 1993 & 1998   CHOLECYSTECTOMY     COLON RESECTION N/A 04/17/1996   COLONOSCOPY WITH PROPOFOL N/A 11/06/2018   Procedure: COLONOSCOPY WITH PROPOFOL;  Surgeon: Toney Reil, MD;  Location: [facility];  Service: Endoscopy;  Laterality: N/A;   COLONOSCOPY WITH PROPOFOL N/A 11/03/2020   Procedure: COLONOSCOPY WITH PROPOFOL;  Surgeon: Toney Reil, MD;  Location: [facility];  Service: Gastroenterology;  Laterality: N/A;   CYSTOSCOPY  12/23/2014   Procedure: CYSTOSCOPY;  Surgeon: Leida Lauth, MD;  Location: [facility];  Service: Gynecology;;   EYE SURGERY     HYSTEROSCOPY WITH D & C N/A 10/07/2014   Procedure: DILATATION AND CURETTAGE /HYSTEROSCOPY;  Surgeon: Leida Lauth, MD;  Location: [facility];  Service: Gynecology;  Laterality: N/A;   LAPAROSCOPIC ASSISTED VAGINAL HYSTERECTOMY N/A 12/23/2014   Procedure: LAPAROSCOPIC ASSISTED VAGINAL HYSTERECTOMY, attempted;  Surgeon: Leida Lauth, MD;  Location: [facility];  Service: Gynecology;  Laterality: N/A;   POLYPECTOMY N/A 11/06/2018   Procedure: POLYPECTOMY;  Surgeon: Toney Reil, MD;  Location: [facility];  Service: Endoscopy;  Laterality: N/A;   URETER  SURGERY Left 04/17/1977   Current Outpatient Medications:    Adalimumab-adaz 40 MG/0.4ML SOAJ, INJECT 1 PEN INTO THE SKIN EVERY 14 (FOURTEEN) DAYS., Disp: 2.4 mL, Rfl: 1   ALPRAZolam (XANAX) 1 MG tablet, Take 1 mg by mouth at bedtime as needed for anxiety or sleep. prn, Disp: , Rfl:    BREO ELLIPTA 200-25 MCG/ACT AEPB, Inhale 1 puff into the lungs daily., Disp: , Rfl:    cetirizine (ZYRTEC) 10 MG tablet, Take 10 mg by mouth daily., Disp: ,  Rfl:    citalopram (CELEXA) 10 MG tablet, Take 20 mg by mouth every morning., Disp: , Rfl:    Multiple Vitamins-Minerals (HAIR SKIN AND NAILS FORMULA) TABS, Take by mouth., Disp: , Rfl:    omeprazole (PRILOSEC) 20 MG capsule, Take by mouth daily., Disp: , Rfl:    PROAIR HFA 108 (90 BASE) MCG/ACT inhaler, Inhale 2 puffs into the lungs every 4 (four) hours as needed for wheezing or shortness of breath. Every 4 to 6 hours prn, Disp: , Rfl: 3   traZODone (DESYREL) 100 MG tablet, Take 100 mg by mouth at bedtime., Disp: , Rfl:    Vitamin D, Ergocalciferol, (DRISDOL) 1.25 MG (50000 UNIT) CAPS capsule, Take 1 capsule by mouth once a week., Disp: , Rfl:    WEGOVY 1 MG/0.5ML SOAJ, Inject 1 mg into the skin once a week., Disp: , Rfl:    Family History  Problem Relation Age of Onset   Heart attack Father        Had Triple Bypass   Lung disease Mother        Double lung transplant 2011     Social History   Tobacco Use   Smoking status: Former    Packs/day: 0.25    Years: 30.00    Additional pack years: 0.00    Total pack years: 7.50    Types: Cigarettes    Quit date: 01/15/2017    Years since quitting: 5.5   Smokeless tobacco: Never   Tobacco comments:    (10/30/18) currently only smokes 2-3 cigs/week  Vaping Use   Vaping Use: Never used  Substance Use Topics   Alcohol use: Yes    Comment: rarely   Drug use: No    Allergies as of 08/09/2022 - Review Complete 08/09/2022  Allergen Reaction Noted   Codeine Nausea And Vomiting 12/10/2014   Percocet [oxycodone-acetaminophen] Nausea And Vomiting 12/10/2014   Vicodin [hydrocodone-acetaminophen] Nausea And Vomiting 12/10/2014   Morphine Hives and Nausea And Vomiting 09/16/2014   Nickel Rash 09/16/2014   Penicillin g Rash 09/16/2014    Review of Systems:    All systems reviewed and negative except where noted in HPI.   Physical Exam:  BP 122/80 (BP Location: Left Arm, Patient Position: Sitting, Cuff Size: Normal)   Pulse 90   Temp  98.3 F (36.8 C) (Oral)   Ht 5\' 1"  (1.549 m)   Wt 136 lb 2 oz (61.7 kg)   LMP 12/08/2014   BMI 25.72 kg/m  Patient's last menstrual period was 12/08/2014.  General:   Alert,  Well-developed, well-nourished, pleasant and cooperative in NAD Head:  Normocephalic and atraumatic. Eyes:  Sclera clear, no icterus.   Conjunctiva pink. Ears:  Normal auditory acuity. Nose:  No deformity, discharge, or lesions. Mouth:  No deformity or lesions,oropharynx pink & moist. Neck:  Supple; no masses or thyromegaly. Lungs:  Respirations even and unlabored.  Clear throughout to auscultation.   No wheezes, crackles, or rhonchi. No acute distress. Heart:  Regular rate and rhythm; no murmurs, clicks, rubs, or gallops. Abdomen:  Normal bowel sounds. Soft, obese, non-tender and nondistended without masses, hepatosplenomegaly or hernias noted.  No guarding or rebound tenderness.   Rectal: Not performed Msk:  Symmetrical without gross deformities. Good, equal movement & strength bilaterally. Pulses:  Normal pulses noted. Extremities:  No clubbing or edema.  No cyanosis. Neurologic:  Alert and oriented x3;  grossly normal neurologically. Skin:  Intact without significant lesions or rashes. No jaundice. Psych:  Alert and cooperative. Normal mood and affect.  Imaging Studies: Reviewed  Assessment and Plan:   Elizabeth Zhang is a 57 y.o. female with history of small bowel Crohn's status post ileocecal resection is seen for follow-up of postop recurrence of small bowel Crohn's  Crohn's disease: Currently in clinical and endoscopic remission Last colonoscopy in 10/2020 with no obvious evidence of postop recurrence, however ileocolonic anastomosis could not be traversed MR enterography in 11/13/2020 did not reveal any evidence of active ileitis.  There is no evidence of inflammatory or fibrous small bowel stricture CRP is normal Normal fecal calprotectin levels, recheck levels Patient was on weekly Humira,  switched back to biweekly Humira since weekly Humira has been denied by her insurance despite several appeals, no evidence of adalimumab antibodies, trough levels in therapeutic range Check CBC and CMP every 6 months, recheck today Patient was empirically treated with 2 weeks course of rifaximin for abdominal bloating from bacterial overgrowth in the past  IBD Health Maintenance  1.TB status: QuantiFERON gold negative 2. Anemia: She had polycythemia and elevated ferritin levels.  Family history of polycythemia.  Patient is evaluated by hematology, no evidence of polycythemia vera 3.Immunizations: Twinrix vaccine, first dose 01/22/2019, second dose 02/2019, third dose 06/2019, annual influenza vaccine, prevnar on 01/22/2019, pneumovax 06/2019, Varicella unknown, Shingrix received first dose on 06/14/2018, recommend booster, prescription given during last visit, remind again 4.Cancer screening I) Colon cancer/dysplasia surveillance: Up-to-date  II) Cervical cancer: Annual Pap smear up-to-date, normal III) Skin cancer - counseled about annual skin exam by dermatology and skin protection in summer using sun screen SPF > 50, clothing 5.Bone health Vitamin D status: Normal Bone density testing: DEXA normal per patient 5. Labs: Ordered 6. Smoking: Never smoked 7. NSAIDs and Antibiotics use: None   Follow up every 6 months  [name], MD

## 2022-08-10 LAB — COMPREHENSIVE METABOLIC PANEL
ALT: 11 IU/L (ref 0–32)
AST: 17 IU/L (ref 0–40)
Albumin/Globulin Ratio: 2 (ref 1.2–2.2)
Albumin: 4.3 g/dL (ref 3.8–4.9)
Alkaline Phosphatase: 79 IU/L (ref 44–121)
BUN/Creatinine Ratio: 12 (ref 9–23)
BUN: 10 mg/dL (ref 6–24)
Bilirubin Total: 0.4 mg/dL (ref 0.0–1.2)
CO2: 22 mmol/L (ref 20–29)
Calcium: 9.4 mg/dL (ref 8.7–10.2)
Chloride: 108 mmol/L — ABNORMAL HIGH (ref 96–106)
Creatinine, Ser: 0.85 mg/dL (ref 0.57–1.00)
Globulin, Total: 2.1 g/dL (ref 1.5–4.5)
Glucose: 78 mg/dL (ref 70–99)
Potassium: 4.5 mmol/L (ref 3.5–5.2)
Sodium: 143 mmol/L (ref 134–144)
Total Protein: 6.4 g/dL (ref 6.0–8.5)
eGFR: 81 mL/min/{1.73_m2} (ref >59)

## 2022-08-10 LAB — CBC
Hematocrit: 47.6 % — ABNORMAL HIGH (ref 34.0–46.6)
Hemoglobin: 16.4 g/dL — ABNORMAL HIGH (ref 11.1–15.9)
MCH: 31.1 pg (ref 26.6–33.0)
MCHC: 34.5 g/dL (ref 31.5–35.7)
MCV: 90 fL (ref 79–97)
Platelets: 207 10*3/uL (ref 150–450)
RBC: 5.27 x10E6/uL (ref 3.77–5.28)
RDW: 11.7 % (ref 11.7–15.4)
WBC: 7 10*3/uL (ref 3.4–10.8)

## 2022-09-14 ENCOUNTER — Other Ambulatory Visit: Payer: Self-pay | Admitting: Gastroenterology

## 2022-09-14 MED ORDER — ADALIMUMAB-ADAZ 40 MG/0.4ML ~~LOC~~ SOAJ: 1.0000 | SUBCUTANEOUS | 4 refills | Status: DC

## 2022-09-14 NOTE — Telephone Encounter (Signed)
Last office visit 08/09/2022 Crohn's disease  Last refill

## 2023-01-31 ENCOUNTER — Telehealth: Payer: Self-pay

## 2023-01-31 NOTE — Telephone Encounter (Signed)
Submitted PA through Cover my meds for the [employer]im[employer]. Waiting on response from insurance company

## 2023-02-12 ENCOUNTER — Ambulatory Visit: Payer: 59 | Admitting: Gastroenterology

## 2023-02-12 ENCOUNTER — Encounter: Payer: Self-pay | Admitting: Gastroenterology

## 2023-02-12 VITALS — BP 121/71 | HR 66 | Temp 97.4°F | Ht 61.0 in | Wt 126.2 lb

## 2023-02-12 DIAGNOSIS — K5000 Crohn's disease of small intestine without complications: Secondary | ICD-10-CM

## 2023-02-12 DIAGNOSIS — K50012 Crohn's disease of small intestine with intestinal obstruction: Secondary | ICD-10-CM

## 2023-02-12 MED ORDER — SHINGRIX 50 MCG/0.5ML IM SUSR
0.5000 mL | Freq: Once | INTRAMUSCULAR | 0 refills | Status: AC
Start: 2023-02-12 — End: 2023-02-12

## 2023-02-12 MED ORDER — ADALIMUMAB-ADAZ 40 MG/0.4ML ~~LOC~~ SOAJ
1.0000 | SUBCUTANEOUS | 5 refills | Status: AC
Start: 2023-02-12 — End: ?

## 2023-02-12 NOTE — Progress Notes (Signed)
Elizabeth Repress, MD [address]  [address]  [place], [place] [address]  Main: [phone]  Fax: [phone]    Gastroenterology Consultation  Referring Provider:     Cassell Clement, MD Primary Care Physician:  Elizabeth Clement, MD Primary Gastroenterologist:  Dr. Arlyss Zhang Reason for Consultation:     Small bowel Crohn's, postop recurrence        HPI:   Elizabeth Zhang is a 57 y.o. female referred by Dr. Cassell Clement, MD  for consultation & management of postop recurrence of small bowel Crohn's  Patient initially saw me for screening colonoscopy on 11/06/2018.  She was found to have ileocolonic anastomosis with anastomotic stricture and unable to traverse.  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  Subsequently, she underwent CT enterography which revealed thickening of the neoterminal ileum up to 5 cm in length.  Patient has history of Crohn's disease at age 58, has been maintained on steroids for about 12 years and mesalamine derivatives.  She underwent ileocolonic resection in 1998 secondary to small bowel obstruction.  Since then, patient has not been on any maintenance therapy for Crohn's.  However, over the last 3 months she has been experiencing central abdominal pain associated with abdominal bloating, several episodes of dark bowel movements.  She reports anywhere from 10 to 12/day.  She reports feeling tired.  She also reports large joint arthralgias.  She lost about 10 to 12 pounds in last 3 months.  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  Patient had 2 pregnancies several years ago which were uneventful without any exacerbation of Crohn's.  She also had rectosigmoid surgery when she had hysterectomy complicated by perforation. The latest labs from 2016 revealed normal hemoglobin, mild leukocytosis  Follow-up visit 10/03/2021 Patient is doing well from a Crohn's standpoint.  She  lost about 30 pounds intentionally by following low carbohydrate diet.  She does not have any concerns today.  She does smoke cigarettes.  She had labs done on 09/17/2021 by her PCP, reviewed on her phone, normal CBC, CMP, vitamin D levels  Follow-up visit 08/09/22 Elizabeth Zhang is here for follow-up of Crohn's disease.  She lost about 10 more pounds since last visit, continues to follow clean diet, eliminated all processed foods, pasta, bread, red meat, rice.  She does not have any concerns today.  She stopped smoking.  She continues to take Humira biosimilar every other week.  Does not have any GI concerns.  She also feels that her GI system is working well since she has eliminated processed foods  Follow-up visit 02/12/2023 Elizabeth Zhang is here for follow-up of Crohn's disease.  She is on Wegovy, lost about 10 pounds since last visit.  She lost about 40 pounds on her own.  She is at her desirable weight.  She will be on [facility] for 2 more months as reported by patient.  She continues to take Humira biosimilar every other week.  Denies any GI concerns.  She did not receive Shingrix booster yet.  Crohn's disease classification:  Age: < 17  Location: Ileal Behavior: stricturing  Perianal:  no  IBD diagnosis: At age 31  Disease course:Patient has history of Crohn's disease at age 61, has been maintained on steroids for about 12 years and mesalamine derivatives.  She underwent ileocolonic resection in 1998 secondary to small bowel obstruction.  Since then, patient has not been on any  maintenance therapy for Crohn's.  However, over the last 3 months she has been experiencing central abdominal pain associated with abdominal bloating, several episodes of dark bowel movements.  She reports anywhere from 10 to 12/day.  She reports feeling tired.  She also reports large joint arthralgias.  She lost about 10 to 12 pounds in last 3 months.  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  Patient had 2  pregnancies several years ago which were uneventful without any exacerbation of Crohn's.  She also had rectosigmoid surgery when she had hysterectomy complicated by perforation.  Maintained on Humira weekly.  Switched to Humira biweekly in November 2021.  On Humira bio similar since 05/2022  Extra intestinal manifestations: Large joint arthralgias  IBD surgical history: Ileocecal resection secondary to small bowel obstruction in 1998  Imaging:  MRE none CTE 12/04/2018 IMPRESSION: 1. Wall thickening in the distal 5 cm of the ileum and in the proximal colon suggesting mild to moderate enterocolitis. This could well be a manifestation of Crohn's disease. No abscess or extraluminal gas. 2. Partial colectomy with anastomotic staple line in the upper pelvis. 3. Otherwise normal appearance of the small bowel.  SBFT 05/03/2011 Findings raising concern of decreased peristalsis within  the distal and terminal ileum without further abnormalities.  Procedures: Colonoscopy 11/03/2020 - Unable to locate the opening of neoterminal ileum end-to-side ileo-colonic anastomosis, characterized by unknown. - The entire examined colon is normal. - The distal rectum and anal verge are normal on retroflexion view. - Patent functional end-to-end colo-colonic anastomosis, characterized by healthy appearing mucosa. - No specimens collected.   Colonoscopy  11/06/2018 - Perianal skin tags found on perianal exam. - End-to-side ileo-colonic anastomosis, characterized by healthy appearing mucosa, unable to traverse the anastomosis. - Three 5 to 7 mm polyps in the ascending colon, removed with a hot snare. Resected and retrieved. - Patent functional end-to-end colo-rectal anastomosis, characterized by healthy appearing mucosa. - The distal rectum and anal verge are normal on retroflexion view.   Upper Endoscopy none  VCE none  IBD medications:  Steroids: Prednisone yes, budesonide yes 5-ASA: Previously on  mesalamine Immunomodulators: AZA, methotrexate : None TPMT status unknown Biologics: Anti TNFs: Humira biweekly started since November 2020 Anti Integrins: Ustekinumab: Tofactinib: Clinical trial:   Past Medical History:  Diagnosis Date   Asthma    Cesarean delivery delivered 1993 & 1998   x2   Crohn disease (HCC)    1980   Duplicated ureter, left    HGSIL (high grade squamous intraepithelial lesion) on Pap smear of cervix     Past Surgical History:  Procedure Laterality Date   ABDOMINAL HYSTERECTOMY N/A 12/23/2014   Procedure: HYSTERECTOMY ABDOMINAL REPAIR OF COLOTOMY;  Surgeon: Leida Lauth, MD;  Location: [facility];  Service: Gynecology;  Laterality: N/A;   APPENDECTOMY     CESAREAN SECTION N/A 1993 & 1998   CHOLECYSTECTOMY     COLON RESECTION N/A 04/17/1996   COLONOSCOPY WITH PROPOFOL N/A 11/06/2018   Procedure: COLONOSCOPY WITH PROPOFOL;  Surgeon: Toney Reil, MD;  Location: [facility];  Service: Endoscopy;  Laterality: N/A;   COLONOSCOPY WITH PROPOFOL N/A 11/03/2020   Procedure: COLONOSCOPY WITH PROPOFOL;  Surgeon: Toney Reil, MD;  Location: [facility];  Service: Gastroenterology;  Laterality: N/A;   CYSTOSCOPY  12/23/2014   Procedure: CYSTOSCOPY;  Surgeon: Leida Lauth, MD;  Location: [facility];  Service: Gynecology;;   EYE SURGERY     HYSTEROSCOPY WITH D & C N/A 10/07/2014   Procedure:  DILATATION AND CURETTAGE /HYSTEROSCOPY;  Surgeon: Leida Lauth, MD;  Location: [facility];  Service: Gynecology;  Laterality: N/A;   LAPAROSCOPIC ASSISTED VAGINAL HYSTERECTOMY N/A 12/23/2014   Procedure: LAPAROSCOPIC ASSISTED VAGINAL HYSTERECTOMY, attempted;  Surgeon: Leida Lauth, MD;  Location: [facility];  Service: Gynecology;  Laterality: N/A;   POLYPECTOMY N/A 11/06/2018   Procedure: POLYPECTOMY;  Surgeon: Toney Reil, MD;  Location: [facility];  Service: Endoscopy;  Laterality: N/A;   URETER SURGERY Left 04/17/1977   Current  Outpatient Medications:    ALPRAZolam (XANAX) 1 MG tablet, Take 1 mg by mouth at bedtime as needed for anxiety or sleep. prn, Disp: , Rfl:    BREO ELLIPTA 200-25 MCG/ACT AEPB, Inhale 1 puff into the lungs daily., Disp: , Rfl:    cetirizine (ZYRTEC) 10 MG tablet, Take 10 mg by mouth daily., Disp: , Rfl:    Multiple Vitamins-Minerals (HAIR SKIN AND NAILS FORMULA) TABS, Take by mouth., Disp: , Rfl:    omeprazole (PRILOSEC) 20 MG capsule, Take by mouth daily., Disp: , Rfl:    PROAIR HFA 108 (90 BASE) MCG/ACT inhaler, Inhale 2 puffs into the lungs every 4 (four) hours as needed for wheezing or shortness of breath. Every 4 to 6 hours prn, Disp: , Rfl: 3   traZODone (DESYREL) 100 MG tablet, Take 100 mg by mouth at bedtime., Disp: , Rfl:    Vitamin D, Ergocalciferol, (DRISDOL) 1.25 MG (50000 UNIT) CAPS capsule, Take 1 capsule by mouth once a week., Disp: , Rfl:    WEGOVY 1 MG/0.5ML SOAJ, Inject 1 mg into the skin once a week., Disp: , Rfl:    Zoster Vaccine Adjuvanted (SHINGRIX) injection, Inject 0.5 mLs into the muscle once for 1 dose., Disp: 1 each, Rfl: 0   Adalimumab-adaz 40 MG/0.4ML SOAJ, Inject 1 Pen into the skin every 14 (fourteen) days., Disp: 2.4 mL, Rfl: 5   Family History  Problem Relation Age of Onset   Heart attack Father        Had Triple Bypass   Lung disease Mother        Double lung transplant 2011     Social History   Tobacco Use   Smoking status: Every Day    Current packs/day: 0.25    Average packs/day: 0.3 packs/day for 30.4 years (7.6 ttl pk-yrs)    Types: Cigarettes    Start date: 01/16/1987    Last attempt to quit: 01/15/2017   Smokeless tobacco: Never   Tobacco comments:    (10/30/18) currently only smokes 2-3 cigs/week  Vaping Use   Vaping status: Never Used  Substance Use Topics   Alcohol use: Yes    Comment: rarely   Drug use: No    Allergies as of 02/12/2023 - Review Complete 02/12/2023  Allergen Reaction Noted   Codeine Nausea And Vomiting 12/10/2014    Percocet [oxycodone-acetaminophen] Nausea And Vomiting 12/10/2014   Vicodin [hydrocodone-acetaminophen] Nausea And Vomiting 12/10/2014   Morphine Hives and Nausea And Vomiting 09/16/2014   Nickel Rash 09/16/2014   Penicillin g Rash 09/16/2014    Review of Systems:    All systems reviewed and negative except where noted in HPI.   Physical Exam:  BP 121/71 (BP Location: Left Arm, Patient Position: Sitting, Cuff Size: Normal)   Pulse 66   Temp (!) 97.4 F (36.3 C) (Oral)   Ht 5\' 1"  (1.549 m)   Wt 126 lb 4 oz (57.3 kg)   LMP 12/08/2014   BMI 23.85 kg/m  Patient's last menstrual  period was 12/08/2014.  General:   Alert,  Well-developed, well-nourished, pleasant and cooperative in NAD Head:  Normocephalic and atraumatic. Eyes:  Sclera clear, no icterus.   Conjunctiva pink. Ears:  Normal auditory acuity. Nose:  No deformity, discharge, or lesions. Mouth:  No deformity or lesions,oropharynx pink & moist. Neck:  Supple; no masses or thyromegaly. Lungs:  Respirations even and unlabored.  Clear throughout to auscultation.   No wheezes, crackles, or rhonchi. No acute distress. Heart:  Regular rate and rhythm; no murmurs, clicks, rubs, or gallops. Abdomen:  Normal bowel sounds. Soft, obese, non-tender and nondistended without masses, hepatosplenomegaly or hernias noted.  No guarding or rebound tenderness.   Rectal: Not performed Msk:  Symmetrical without gross deformities. Good, equal movement & strength bilaterally. Pulses:  Normal pulses noted. Extremities:  No clubbing or edema.  No cyanosis. Neurologic:  Alert and oriented x3;  grossly normal neurologically. Skin:  Intact without significant lesions or rashes. No jaundice. Psych:  Alert and cooperative. Normal mood and affect.  Imaging Studies: Reviewed  Assessment and Plan:   AVERIANNA GHAFFARI is a 57 y.o. female with history of small bowel Crohn's status post ileocecal resection is seen for follow-up of postop recurrence of  small bowel Crohn's  Crohn's disease: Currently in clinical and endoscopic remission Last colonoscopy in 10/2020 with no obvious evidence of postop recurrence, however ileocolonic anastomosis could not be traversed MR enterography in 11/13/2020 did not reveal any evidence of active ileitis.  There is no evidence of inflammatory or fibrous small bowel stricture CRP is normal Normal fecal calprotectin levels Patient was on weekly Humira, switched back to biweekly Humira since weekly Humira has been denied by her insurance despite several appeals, no evidence of adalimumab antibodies, trough levels in therapeutic range Check CBC and CMP every 6 months, recheck today Patient was empirically treated with 2 weeks course of rifaximin for abdominal bloating from bacterial overgrowth in the past  IBD Health Maintenance  1.TB status: QuantiFERON gold negative 2. Anemia: She had polycythemia and elevated ferritin levels.  Family history of polycythemia.  Patient is evaluated by hematology, no evidence of polycythemia vera 3.Immunizations: Twinrix vaccine, first dose 01/22/2019, second dose 02/2019, third dose 06/2019, annual influenza vaccine, prevnar on 01/22/2019, pneumovax 06/2019, Varicella unknown, Shingrix received first dose on 06/14/2018, recommend booster, prescription sent again today 4.Cancer screening I) Colon cancer/dysplasia surveillance: Up-to-date  II) Cervical cancer:Pap smear up-to-date, normal, undergoes every 6 months due to In Utero DES exposure III) Skin cancer - counseled about annual skin exam by dermatology and skin protection in summer using sun screen SPF > 50, clothing 5.Bone health Vitamin D status: Normal Bone density testing: DEXA normal per patient 5. Labs: Ordered 6. Smoking: Never smoked 7. NSAIDs and Antibiotics use: None   Follow up every 6 months  Elizabeth Repress, MD

## 2023-02-13 LAB — CBC WITH DIFFERENTIAL/PLATELET
Basophils Absolute: 0.1 10*3/uL (ref 0.0–0.2)
Basos: 2 %
EOS (ABSOLUTE): 0.2 10*3/uL (ref 0.0–0.4)
Eos: 4 %
Hematocrit: 49.3 % — ABNORMAL HIGH (ref 34.0–46.6)
Hemoglobin: 16.2 g/dL — ABNORMAL HIGH (ref 11.1–15.9)
Immature Grans (Abs): 0 10*3/uL (ref 0.0–0.1)
Immature Granulocytes: 0 %
Lymphocytes Absolute: 1.7 10*3/uL (ref 0.7–3.1)
Lymphs: 27 %
MCH: 31.5 pg (ref 26.6–33.0)
MCHC: 32.9 g/dL (ref 31.5–35.7)
MCV: 96 fL (ref 79–97)
Monocytes Absolute: 0.6 10*3/uL (ref 0.1–0.9)
Monocytes: 10 %
Neutrophils Absolute: 3.5 10*3/uL (ref 1.4–7.0)
Neutrophils: 57 %
Platelets: 179 10*3/uL (ref 150–450)
RBC: 5.14 x10E6/uL (ref 3.77–5.28)
RDW: 12 % (ref 11.7–15.4)
WBC: 6.1 10*3/uL (ref 3.4–10.8)

## 2023-02-13 LAB — HEPATIC FUNCTION PANEL
ALT: 14 [IU]/L (ref 0–32)
AST: 20 [IU]/L (ref 0–40)
Albumin: 4.2 g/dL (ref 3.8–4.9)
Alkaline Phosphatase: 88 [IU]/L (ref 44–121)
Bilirubin Total: 0.2 mg/dL (ref 0.0–1.2)
Bilirubin, Direct: <0.1 mg/dL (ref 0.00–0.40)
Total Protein: 6.6 g/dL (ref 6.0–8.5)

## 2023-08-16 ENCOUNTER — Other Ambulatory Visit: Payer: Self-pay

## 2023-08-16 ENCOUNTER — Ambulatory Visit: Admitting: Gastroenterology

## 2023-08-16 ENCOUNTER — Encounter: Payer: Self-pay | Admitting: Gastroenterology

## 2023-08-16 VITALS — BP 134/87 | HR 79 | Temp 97.9°F | Ht 61.0 in | Wt 136.2 lb

## 2023-08-16 DIAGNOSIS — K5000 Crohn's disease of small intestine without complications: Secondary | ICD-10-CM

## 2023-08-16 DIAGNOSIS — R635 Abnormal weight gain: Secondary | ICD-10-CM | POA: Diagnosis not present

## 2023-08-16 DIAGNOSIS — R400 Somnolence: Secondary | ICD-10-CM | POA: Diagnosis not present

## 2023-08-16 DIAGNOSIS — R5383 Other fatigue: Secondary | ICD-10-CM | POA: Diagnosis not present

## 2023-08-16 DIAGNOSIS — K50012 Crohn's disease of small intestine with intestinal obstruction: Secondary | ICD-10-CM

## 2023-08-16 DIAGNOSIS — D751 Secondary polycythemia: Secondary | ICD-10-CM

## 2023-08-16 NOTE — Progress Notes (Signed)
 Karma Oz, MD [address]  [address]  [place], [place] [address]  Main: [phone]  Fax: [phone]    Gastroenterology Consultation  Referring Provider:     Knowles-Jonas, Lynde, MD Primary Care Physician:  Knowles-Jonas, Lynde, MD Primary Gastroenterologist:  Dr. Karma Oz Reason for Consultation:     Small bowel Crohn's, postop recurrence        HPI:   Elizabeth Zhang is a 58 y.o. female referred by Dr. Michele Ahle, MD  for consultation & management of postop recurrence of small bowel Crohn's  Patient initially saw me for screening colonoscopy on 11/06/2018.  She was found to have ileocolonic anastomosis with anastomotic stricture and unable to traverse.  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  Subsequently, she underwent CT enterography which revealed thickening of the neoterminal ileum up to 5 cm in length.  Patient has history of Crohn's disease at age 55, has been maintained on steroids for about 12 years and mesalamine derivatives.  She underwent ileocolonic resection in 1998 secondary to small bowel obstruction.  Since then, patient has not been on any maintenance therapy for Crohn's.  However, over the last 3 months she has been experiencing central abdominal pain associated with abdominal bloating, several episodes of dark bowel movements.  She reports anywhere from 10 to 12/day.  She reports feeling tired.  She also reports large joint arthralgias.  She lost about 10 to 12 pounds in last 3 months.  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  Patient had 2 pregnancies several years ago which were uneventful without any exacerbation of Crohn's.  She also had rectosigmoid surgery when she had hysterectomy complicated by perforation. The latest labs from 2016 revealed normal hemoglobin, mild leukocytosis  Follow-up visit 10/03/2021 Patient is doing well from a Crohn's standpoint.  She  lost about 30 pounds intentionally by following low carbohydrate diet.  She does not have any concerns today.  She does smoke cigarettes.  She had labs done on 09/17/2021 by her PCP, reviewed on her phone, normal CBC, CMP, vitamin D levels  Follow-up visit 08/09/22 [name] is here for follow-up of Crohn's disease.  She lost about 10 more pounds since last visit, continues to follow clean diet, eliminated all processed foods, pasta, bread, red meat, rice.  She does not have any concerns today.  She stopped smoking.  She continues to take Humira  biosimilar every other week.  Does not have any GI concerns.  She also feels that her GI system is working well since she has eliminated processed foods  Follow-up visit 02/12/2023 [name] is here for follow-up of Crohn's disease.  She is on Wegovy, lost about 10 pounds since last visit.  She lost about 40 pounds on her own.  She is at her desirable weight.  She will be on [facility] for 2 more months as reported by patient.  She continues to take Humira  biosimilar every other week.  Denies any GI concerns.  She did not receive Shingrix  booster yet.  Follow-up visit 08/16/2023 [name] is here for follow-up of Crohn's disease.  She has regained 10 pounds back since last visit.  She reports feeling fatigued, more sleepiness and irregular bowel movements, once every 3 to 4 days and feel bloated.  She admits to eating chicken every day.  She does not do any exercise apart from being on toes at work.  Her  work involves a lot of field trips and walking quite a bit.  She does not have any GI symptoms otherwise.  She did not get Shingrix  booster dose yet  Crohn's disease classification:  Age: < 17  Location: Ileal Behavior: stricturing  Perianal:  no  IBD diagnosis: At age 18  Disease course:Patient has history of Crohn's disease at age 33, has been maintained on steroids for about 12 years and mesalamine derivatives.  She underwent ileocolonic resection  in 1998 secondary to small bowel obstruction.  Since then, patient has not been on any maintenance therapy for Crohn's.  However, over the last 3 months she has been experiencing central abdominal pain associated with abdominal bloating, several episodes of dark bowel movements.  She reports anywhere from 10 to 12/day.  She reports feeling tired.  She also reports large joint arthralgias.  She lost about 10 to 12 pounds in last 3 months.  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  Patient had 2 pregnancies several years ago which were uneventful without any exacerbation of Crohn's.  She also had rectosigmoid surgery when she had hysterectomy complicated by perforation.  Maintained on Humira  weekly.  Switched to Humira  biweekly in November 2021.  On Humira  bio similar since 05/2022  Extra intestinal manifestations: Large joint arthralgias  IBD surgical history: Ileocecal resection secondary to small bowel obstruction in 1998  Imaging:  MRE none CTE 12/04/2018 IMPRESSION: 1. Wall thickening in the distal 5 cm of the ileum and in the proximal colon suggesting mild to moderate enterocolitis. This could well be a manifestation of Crohn's disease. No abscess or extraluminal gas. 2. Partial colectomy with anastomotic staple line in the upper pelvis. 3. Otherwise normal appearance of the small bowel.  SBFT 05/03/2011 Findings raising concern of decreased peristalsis within  the distal and terminal ileum without further abnormalities.  Procedures: Colonoscopy 11/03/2020 - Unable to locate the opening of neoterminal ileum end-to-side ileo-colonic anastomosis, characterized by unknown. - The entire examined colon is normal. - The distal rectum and anal verge are normal on retroflexion view. - Patent functional end-to-end colo-colonic anastomosis, characterized by healthy appearing mucosa. - No specimens collected.   Colonoscopy  11/06/2018 - Perianal skin tags found on perianal exam. -  End-to-side ileo-colonic anastomosis, characterized by healthy appearing mucosa, unable to traverse the anastomosis. - Three 5 to 7 mm polyps in the ascending colon, removed with a hot snare. Resected and retrieved. - Patent functional end-to-end colo-rectal anastomosis, characterized by healthy appearing mucosa. - The distal rectum and anal verge are normal on retroflexion view.   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, left    HGSIL (high grade squamous intraepithelial lesion) on Pap smear of cervix     Past Surgical History:  Procedure Laterality Date   ABDOMINAL HYSTERECTOMY N/A 12/23/2014   Procedure: HYSTERECTOMY ABDOMINAL REPAIR OF COLOTOMY;  Surgeon: Hermine Loots, MD;  Location: [facility];  Service: Gynecology;  Laterality: N/A;   APPENDECTOMY     CESAREAN SECTION N/A 1993 & 1998  CHOLECYSTECTOMY     COLON RESECTION N/A 04/17/1996   COLONOSCOPY WITH PROPOFOL  N/A 11/06/2018   Procedure: COLONOSCOPY WITH PROPOFOL ;  Surgeon: Selena Daily, MD;  Location: [facility];  Service: Endoscopy;  Laterality: N/A;   COLONOSCOPY WITH PROPOFOL  N/A 11/03/2020   Procedure: COLONOSCOPY WITH PROPOFOL ;  Surgeon: Selena Daily, MD;  Location: [facility];  Service: Gastroenterology;  Laterality: N/A;   CYSTOSCOPY  12/23/2014   Procedure: CYSTOSCOPY;  Surgeon: Hermine Loots, MD;  Location: [facility];  Service: Gynecology;;   EYE SURGERY     HYSTEROSCOPY WITH D & C N/A 10/07/2014    Procedure: DILATATION AND CURETTAGE /HYSTEROSCOPY;  Surgeon: Hermine Loots, MD;  Location: [facility];  Service: Gynecology;  Laterality: N/A;   LAPAROSCOPIC ASSISTED VAGINAL HYSTERECTOMY N/A 12/23/2014   Procedure: LAPAROSCOPIC ASSISTED VAGINAL HYSTERECTOMY, attempted;  Surgeon: Hermine Loots, MD;  Location: [facility];  Service: Gynecology;  Laterality: N/A;   POLYPECTOMY N/A 11/06/2018   Procedure: POLYPECTOMY;  Surgeon: Selena Daily, MD;  Location: [facility];  Service: Endoscopy;  Laterality: N/A;   URETER SURGERY Left 04/17/1977   Current Outpatient Medications:    Adalimumab -adaz 40 MG/0.4ML SOAJ, Inject 1 Pen into the skin every 14 (fourteen) days., Disp: 2.4 mL, Rfl: 5   ALPRAZolam  (XANAX ) 1 MG tablet, Take 1 mg by mouth at bedtime as needed for anxiety or sleep. prn, Disp: , Rfl:    BREO ELLIPTA 200-25 MCG/ACT AEPB, Inhale 1 puff into the lungs daily., Disp: , Rfl:    cetirizine (ZYRTEC) 10 MG tablet, Take 10 mg by mouth daily., Disp: , Rfl:    Multiple Vitamins-Minerals (HAIR SKIN AND NAILS FORMULA) TABS, Take by mouth., Disp: , Rfl:    omeprazole  (PRILOSEC) 20 MG capsule, Take by mouth daily., Disp: , Rfl:    PROAIR  HFA 108 (90 BASE) MCG/ACT inhaler, Inhale 2 puffs into the lungs every 4 (four) hours as needed for wheezing or shortness of breath. Every 4 to 6 hours prn, Disp: , Rfl: 3   traZODone  (DESYREL ) 100 MG tablet, Take 100 mg by mouth at bedtime., Disp: , Rfl:    Vitamin D, Ergocalciferol, (DRISDOL) 1.25 MG (50000 UNIT) CAPS capsule, Take 1 capsule by mouth once a week., Disp: , Rfl:    WEGOVY 1 MG/0.5ML SOAJ, Inject 1 mg into the skin once a week., Disp: , Rfl:    Family History  Problem Relation Age of Onset   Heart attack Father        Had Triple Bypass   Lung disease Mother        Double lung transplant 2011     Social History   Tobacco Use   Smoking status: Every Day    Current packs/day: 0.25    Average packs/day: 0.3 packs/day for 30.9  years (7.7 ttl pk-yrs)    Types: Cigarettes    Start date: 01/16/1987    Last attempt to quit: 01/15/2017   Smokeless tobacco: Never   Tobacco comments:    (10/30/18) currently only smokes 2-3 cigs/week  Vaping Use   Vaping status: Never Used  Substance Use Topics   Alcohol use: Yes    Comment: rarely   Drug use: No    Allergies as of 08/16/2023 - Review Complete 08/16/2023  Allergen Reaction Noted   Codeine Nausea And Vomiting 12/10/2014   Percocet [oxycodone-acetaminophen ] Nausea And Vomiting 12/10/2014   Vicodin [hydrocodone-acetaminophen ] Nausea And Vomiting 12/10/2014   Morphine Hives and Nausea And Vomiting 09/16/2014   Nickel Rash  09/16/2014   Penicillin g Rash 09/16/2014    Review of Systems:    All systems reviewed and negative except where noted in HPI.   Physical Exam:  BP 134/87 (BP Location: Left Arm, Patient Position: Sitting, Cuff Size: Normal)   Pulse 79   Temp 97.9 F (36.6 C) (Oral)   Ht 5\' 1"  (1.549 m)   Wt 136 lb 4 oz (61.8 kg)   LMP 12/08/2014   BMI 25.74 kg/m  Patient's last menstrual period was 12/08/2014.  General:   Alert,  Well-developed, well-nourished, pleasant and cooperative in NAD Head:  Normocephalic and atraumatic. Eyes:  Sclera clear, no icterus.   Conjunctiva pink. Ears:  Normal auditory acuity. Nose:  No deformity, discharge, or lesions. Mouth:  No deformity or lesions,oropharynx pink & moist. Neck:  Supple; no masses or thyromegaly. Lungs:  Respirations even and unlabored.  Clear throughout to auscultation.   No wheezes, crackles, or rhonchi. No acute distress. Heart:  Regular rate and rhythm; no murmurs, clicks, rubs, or gallops. Abdomen:  Normal bowel sounds. Soft, obese, non-tender and nondistended without masses, hepatosplenomegaly or hernias noted.  No guarding or rebound tenderness.   Rectal: Not performed Msk:  Symmetrical without gross deformities. Good, equal movement & strength bilaterally. Pulses:  Normal pulses  noted. Extremities:  No clubbing or edema.  No cyanosis. Neurologic:  Alert and oriented x3;  grossly normal neurologically. Skin:  Intact without significant lesions or rashes. No jaundice. Psych:  Alert and cooperative. Normal mood and affect.  Imaging Studies: Reviewed  Assessment and Plan:   Elizabeth Zhang is a 58 y.o. female with history of small bowel Crohn's status post ileocecal resection is seen for follow-up of postop recurrence of small bowel Crohn's  Crohn's disease: Currently in clinical and endoscopic remission Last colonoscopy in 10/2020 with no obvious evidence of postop recurrence, however ileocolonic anastomosis could not be traversed MR enterography in 11/13/2020 did not reveal any evidence of active ileitis.  There is no evidence of inflammatory or fibrous small bowel stricture CRP is normal Normal fecal calprotectin levels Patient was on weekly Humira , switched back to biweekly Humira  since weekly Humira  has been denied by her insurance despite several appeals, no evidence of adalimumab  antibodies, trough levels in therapeutic range Check CBC and LFTs every 6 months, recheck today  Weight gain, fatigue, increased somnolence Check thyroid profile  IBD Health Maintenance  1.TB status: QuantiFERON gold negative 2. Anemia: She had polycythemia and elevated ferritin levels.  Family history of polycythemia.  Patient is evaluated by hematology, no evidence of polycythemia vera 3.Immunizations: Twinrix vaccine, first dose 01/22/2019, second dose 02/2019, third dose 06/2019, annual influenza vaccine, prevnar on 01/22/2019, pneumovax 06/2019, Varicella unknown, Shingrix  received first dose on 06/14/2018, recommend booster, prescription sent again today 4.Cancer screening I) Colon cancer/dysplasia surveillance: Up-to-date  II) Cervical cancer:Pap smear up-to-date, normal, undergoes every 6 months due to In Utero DES exposure III) Skin cancer - counseled about annual skin exam by  dermatology and skin protection in summer using sun screen SPF > 50, clothing 5.Bone health Vitamin D status: Normal Bone density testing: DEXA normal per patient 5. Labs: Ordered 6. Smoking: Never smoked 7. NSAIDs and Antibiotics use: None   Follow up every 6 months  Karma Oz, MD

## 2023-12-26 ENCOUNTER — Ambulatory Visit: Admitting: Obstetrics & Gynecology

## 2024-01-04 ENCOUNTER — Encounter: Payer: Self-pay | Admitting: Obstetrics

## 2024-01-04 NOTE — Progress Notes (Deleted)
 ANNUAL PREVENTATIVE CARE GYNECOLOGY  ENCOUNTER NOTE  SUBJECTIVE:       Elizabeth Zhang is a 58 y.o. G73P2000 female here for a routine annual gynecologic exam. The patient {is/is not/has never been:13135} sexually active. The patient {is/is not:13135} taking hormone replacement therapy. {post-men bleed:13152::Patient denies post-menopausal vaginal bleeding.} Family history of breast, uterine, ovarian cancer: {yes/no:311178}. The patient wears seatbelts: {yes/no:311178}. The patient participates in regular exercise: {yes/no/not asked:9010}. Has the patient ever been transfused or tattooed?: {yes/no/not asked:9010}. The patient reports that there {is/is not:9024} domestic violence in her life. Has the patient completed the Gardasil vaccine? {yes/no:311178}.  Current complaints: 1.  Patient is being seen by [facility] (Dr. 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  She has had imaging and visits this month and should be starting treatment soon.    Gynecologic History Patient's last menstrual period was 12/08/2014. Contraception: status post hysterectomy Last Pap: 2018. Results were: ASCUS no HPV testing done History of abnormal pap: *** History of STIs: *** Last Mammogram: ***. 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, left    HGSIL (high grade squamous intraepithelial lesion) on Pap smear of cervix      Family History  Problem Relation Age of Onset   Heart attack Father        Had Triple Bypass   Lung disease Mother        Double lung transplant 2011    Past Surgical History:  Procedure Laterality Date   ABDOMINAL HYSTERECTOMY N/A 12/23/2014   Procedure: HYSTERECTOMY ABDOMINAL REPAIR OF COLOTOMY;  Surgeon: Prentice Agent, MD;  Location: [facility];  Service: Gynecology;  Laterality: N/A;   APPENDECTOMY     CESAREAN SECTION N/A 1993 & 1998   CHOLECYSTECTOMY     COLON RESECTION N/A 04/17/1996   COLONOSCOPY WITH PROPOFOL  N/A 11/06/2018   Procedure: COLONOSCOPY WITH PROPOFOL ;  Surgeon: Unk Corinn Skiff, MD;  Location: [facility];  Service: Endoscopy;  Laterality: N/A;   COLONOSCOPY WITH PROPOFOL  N/A 11/03/2020   Procedure: COLONOSCOPY WITH PROPOFOL ;  Surgeon: Unk Corinn Skiff, MD;  Location: [facility] END[facility];  Service: Gastroenterology;  Laterality: N/A;   CYSTOSCOPY  12/23/2014   Procedure: CYSTOSCOPY;  Surgeon: Prentice Agent, MD;  Location: [facility];  Service: Gynecology;;   EYE SURGERY     HYSTEROSCOPY WITH D & C N/A 10/07/2014   Procedure: DILATATION AND CURETTAGE /HYSTEROSCOPY;  Surgeon: Prentice Agent, MD;  Location: [facility];  Service: Gynecology;  Laterality: N/A;   LAPAROSCOPIC ASSISTED VAGINAL HYSTERECTOMY N/A 12/23/2014   Procedure: LAPAROSCOPIC ASSISTED VAGINAL HYSTERECTOMY, attempted;  Surgeon: Prentice Agent, MD;  Location: [facility];  Service:  Gynecology;  Laterality: N/A;   POLYPECTOMY N/A 11/06/2018   Procedure: POLYPECTOMY;  Surgeon: Unk Corinn Skiff, MD;  Location: [facility];  Service: Endoscopy;  Laterality: N/A;   URETER SURGERY Left 04/17/1977    Social History   Socioeconomic History   Marital status: Divorced    Spouse name: Not on file   Number of children: Not on file   Years of education: Not on file   Highest education level: Not on file  Occupational History   Not on file  Tobacco Use   Smoking status: Every Day     Current packs/day: 0.25    Average packs/day: 0.3 packs/day for 31.3 years (7.8 ttl pk-yrs)    Types: Cigarettes    Start date: 01/16/1987    Last attempt to quit: 01/15/2017   Smokeless tobacco: Never   Tobacco comments:    (10/30/18) currently only smokes 2-3 cigs/week  Vaping Use   Vaping status: Never Used  Substance and Sexual Activity   Alcohol use: Yes    Comment: rarely   Drug use: No   Sexual activity: Not on file  Other Topics Concern   Not on file  Social History Narrative   Not on file   Social Drivers of Health   Financial Resource Strain: Low Risk  (12/31/2023)   Received [facility]   Overall Financial Resource Strain (CARDIA)    How hard is it for you to pay for the very basics like food, housing, medical care, and heating?: Not hard at all  Food Insecurity: No Food Insecurity (12/31/2023)   Received from [facility]   Hunger Vital Sign    Within the past 12 months, you worried that your food would run out before you got the money to buy more.: Never true    Within the past 12 months, the food you bought just didn't last and you didn't have money to get more.: Never true  Transportation Needs: No Transportation Needs (12/31/2023)   Received from [facility]   PRAPARE - Transportation    Lack of Transportation (Medical): No    Lack of Transportation (Non-Medical): No  Physical Activity: Not on file  Stress: Not on file  Social Connections: Not on file  Intimate Partner Violence: Not on file    Current Outpatient Medications on File Prior to Visit  Medication Sig Dispense Refill   Adalimumab -adaz 40 MG/0.4ML SOAJ Inject 1 Pen into the skin every 14 (fourteen) days. 2.4 mL 5   ALPRAZolam  (XANAX ) 1 MG tablet Take 1 mg by mouth at bedtime as needed for anxiety or sleep. prn     BREO ELLIPTA 200-25 MCG/ACT AEPB Inhale 1 puff into the lungs daily.     cetirizine (ZYRTEC) 10 MG tablet Take 10 mg by mouth daily.     Multiple Vitamins-Minerals (HAIR  SKIN AND NAILS FORMULA) TABS Take by mouth.     omeprazole  (PRILOSEC) 20 MG capsule Take by mouth daily.     PROAIR  HFA 108 (90 BASE) MCG/ACT inhaler Inhale 2 puffs into the lungs every 4 (four) hours as needed for wheezing or shortness of breath. Every 4 to 6 hours prn  3   traZODone  (DESYREL ) 100 MG tablet Take 100 mg by mouth at bedtime.     Vitamin D, Ergocalciferol, (DRISDOL) 1.25 MG (50000 UNIT) CAPS capsule Take 1 capsule by mouth once a week.     WEGOVY 1 MG/0.5ML SOAJ Inject 1 mg into the skin once a week.  No current facility-administered medications on file prior to visit.    Allergies  Allergen Reactions   Codeine Nausea And Vomiting   Percocet [Oxycodone-Acetaminophen ] Nausea And Vomiting   Vicodin [Hydrocodone-Acetaminophen ] Nausea And Vomiting   Morphine Hives and Nausea And Vomiting   Nickel Rash   Penicillin G Rash     Review of Systems ROS Review of Systems - General ROS: negative for - chills, fatigue, fever, hot flashes, night sweats, weight gain or weight loss Psychological ROS: negative for - anxiety, decreased libido, depression, mood swings, physical abuse or sexual abuse Ophthalmic ROS: negative for - blurry vision, eye pain or loss of vision ENT ROS: negative for - headaches, hearing change, visual changes or vocal changes Allergy and Immunology ROS: negative for - hives, itchy/watery eyes or seasonal allergies Hematological and Lymphatic ROS: negative for - bleeding problems, bruising, swollen lymph nodes or weight loss Endocrine ROS: negative for - galactorrhea, hair pattern changes, hot flashes, malaise/lethargy, mood swings, palpitations, polydipsia/polyuria, skin changes, temperature intolerance or unexpected weight changes Breast ROS: negative for - new or changing breast lumps or nipple discharge Respiratory ROS: negative for - cough or shortness of breath Cardiovascular ROS: negative for - chest pain, irregular heartbeat, palpitations or shortness  of breath Gastrointestinal ROS: no abdominal pain, change in bowel habits, or black or bloody stools Genito-Urinary ROS: no dysuria, trouble voiding, or hematuria Musculoskeletal ROS: negative for - joint pain or joint stiffness Neurological ROS: negative for - bowel and bladder control changes Dermatological ROS: negative for rash and skin lesion changes   OBJECTIVE:   LMP 12/08/2014   CONSTITUTIONAL: Well-developed, well-nourished female in no acute distress.  PSYCHIATRIC: Normal mood and affect. Normal behavior. Normal judgment and thought content. NEUROLGIC: Alert and oriented to person, place, and time. Normal muscle tone coordination. No cranial nerve deficit noted. HENT:  Normocephalic, atraumatic, External right and left ear normal. Oropharynx is clear and moist EYES: Conjunctivae and EOM are normal. No scleral icterus.  NECK: Normal range of motion, supple, no masses.  Normal thyroid.  SKIN: Skin is warm and dry. No rash noted. Not diaphoretic. No erythema. No pallor. CARDIOVASCULAR: Normal heart rate noted, regular rhythm, no murmur. RESPIRATORY: Clear to auscultation bilaterally. Effort and breath sounds normal, no problems with respiration noted. BREASTS: Symmetric in size. No masses, skin changes, nipple drainage, or lymphadenopathy. ABDOMEN: Soft, normal bowel sounds, no distention noted.  No tenderness, rebound or guarding.  PELVIC:  Bladder {:311640}  Urethra: {:311719}  Vulva: {:311722}  Vagina: {:311643}  Cervix: {:311644}  Uterus: {:311718}  Adnexa: {:311645}  RV: {Blank multiple:19196::External Exam NormaI,No Rectal Masses,Normal Sphincter tone}  MUSCULOSKELETAL: Normal range of motion. No tenderness.  No cyanosis, clubbing, or edema.  2+ distal pulses. LYMPHATIC: No Axillary, Supraclavicular, or Inguinal Adenopathy.  Labs: Lab Results  Component Value Date   WBC 6.1 02/12/2023   HGB 16.2 (H) 02/12/2023   HCT 49.3 (H) 02/12/2023   MCV 96 02/12/2023    PLT 179 02/12/2023    Lab Results  Component Value Date   CREATININE 0.85 08/09/2022   BUN 10 08/09/2022   NA 143 08/09/2022   K 4.5 08/09/2022   CL 108 (H) 08/09/2022   CO2 22 08/09/2022    Lab Results  Component Value Date   ALT 14 02/12/2023   AST 20 02/12/2023   ALKPHOS 88 02/12/2023   BILITOT 0.2 02/12/2023    No results found for: CHOL, HDL, LDLCALC, LDLDIRECT, TRIG, CHOLHDL  No results found for: TSH  No  results found for: HGBA1C   ASSESSMENT:   No diagnosis found.   PLAN:   Elizabeth Zhang is a 58 y.o. G61P2000 female here today for her annual exam, doing well.  Pap: done with cotesting today Mammogram: ordered***due *** Colon: PCP *** ordered colonoscopy***Cologuard -OR- due *** Labs: ***A1C, CMP, HepC, Lipid panel, Vit D, TSH PHQ-2 = ***, discussed coping techniques; RTC if worsens or develops concern Contraception: *** Healthy lifestyle modifications discussed: multivitamin, diet, exercise, sunscreen, tobacco and alcohol use. Emphasized importance of regular physical activity.  Calcium and Vit D recommendation reviewed.  All questions answered to patient's satisfaction.   Follow up 1 yr for annual, sooner prn.    Estil Mangle, DO  OB/GYN at [facility]

## 2024-01-08 ENCOUNTER — Encounter: Admitting: Obstetrics
